# Patient Record
Sex: Female | Born: 1975 | Race: White | Hispanic: No | Marital: Married | Smoking: Never smoker
Health system: Southern US, Community
[De-identification: ages and names within clinical notes are randomized; demographics above are authoritative.]

## PROBLEM LIST (undated history)

## (undated) DIAGNOSIS — B977 Papillomavirus as the cause of diseases classified elsewhere: Secondary | ICD-10-CM

## (undated) DIAGNOSIS — F319 Bipolar disorder, unspecified: Secondary | ICD-10-CM

## (undated) DIAGNOSIS — I10 Essential (primary) hypertension: Secondary | ICD-10-CM

## (undated) DIAGNOSIS — A6000 Herpesviral infection of urogenital system, unspecified: Secondary | ICD-10-CM

## (undated) HISTORY — PX: DILATION AND CURETTAGE OF UTERUS: SHX78

## (undated) HISTORY — PX: BREAST SURGERY: SHX581

## (undated) HISTORY — PX: TUBAL LIGATION: SHX77

## (undated) HISTORY — PX: ABDOMINAL HYSTERECTOMY: SHX81

---

## 2004-05-17 ENCOUNTER — Observation Stay: Payer: Self-pay | Admitting: Unknown Physician Specialty

## 2004-05-18 ENCOUNTER — Other Ambulatory Visit: Payer: Self-pay

## 2004-07-03 ENCOUNTER — Observation Stay: Payer: Self-pay

## 2004-08-10 ENCOUNTER — Observation Stay: Payer: Self-pay

## 2004-08-11 ENCOUNTER — Observation Stay: Payer: Self-pay | Admitting: Unknown Physician Specialty

## 2004-08-15 ENCOUNTER — Inpatient Hospital Stay: Payer: Self-pay | Admitting: Obstetrics & Gynecology

## 2006-05-12 ENCOUNTER — Inpatient Hospital Stay: Payer: Self-pay | Admitting: Unknown Physician Specialty

## 2006-05-22 ENCOUNTER — Ambulatory Visit: Payer: Self-pay | Admitting: Unknown Physician Specialty

## 2006-06-12 ENCOUNTER — Ambulatory Visit: Payer: Self-pay | Admitting: Unknown Physician Specialty

## 2006-07-13 ENCOUNTER — Ambulatory Visit: Payer: Self-pay | Admitting: Unknown Physician Specialty

## 2006-08-11 ENCOUNTER — Ambulatory Visit: Payer: Self-pay | Admitting: Unknown Physician Specialty

## 2007-12-20 ENCOUNTER — Other Ambulatory Visit: Payer: Self-pay

## 2007-12-20 ENCOUNTER — Inpatient Hospital Stay: Payer: Self-pay | Admitting: Internal Medicine

## 2008-04-24 ENCOUNTER — Ambulatory Visit: Payer: Self-pay | Admitting: Internal Medicine

## 2008-12-29 ENCOUNTER — Ambulatory Visit: Payer: Self-pay | Admitting: Obstetrics & Gynecology

## 2009-02-13 IMAGING — CT CT HEAD WITHOUT CONTRAST
2 series · 16 of 30 positions shown, 20 images · non-contrast
Comparison: none

REASON FOR EXAM: Unresponsive
COMMENTS:

[Series 2: without · axial · non-contrast · 0.39mm/px · z∈[-88,+36]mm · 13 of 31 slices shown, 17 images]
[im 3/31  brain]
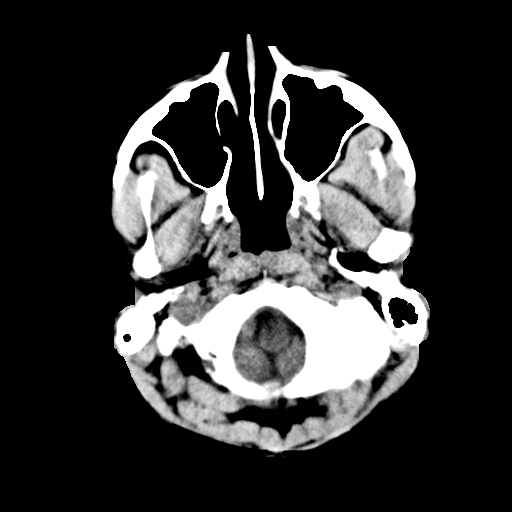
[im 3/31  bone]
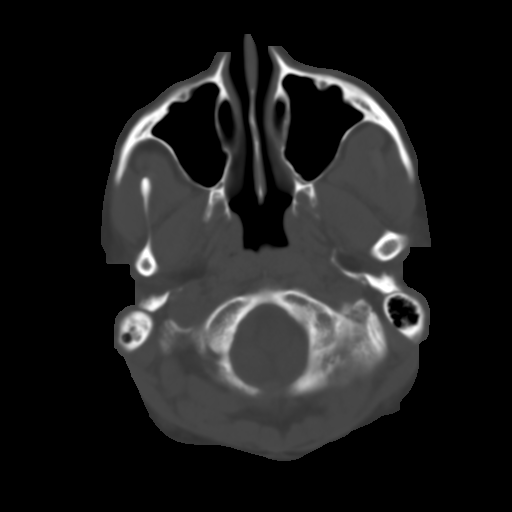
[im 5/31  brain]
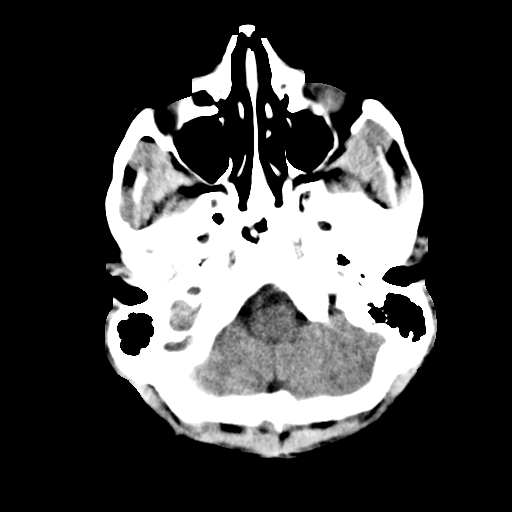
[im 7/31  brain]
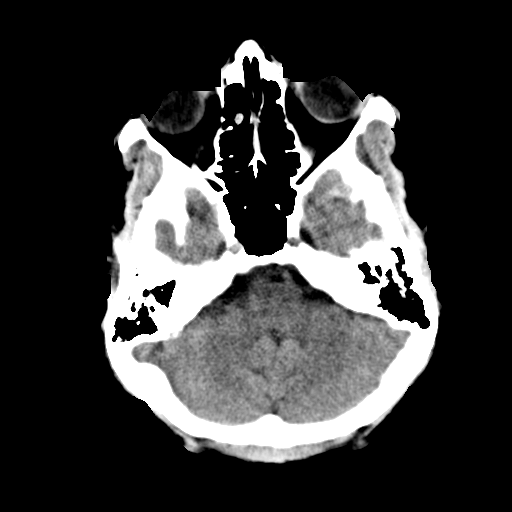
[im 9/31  brain]
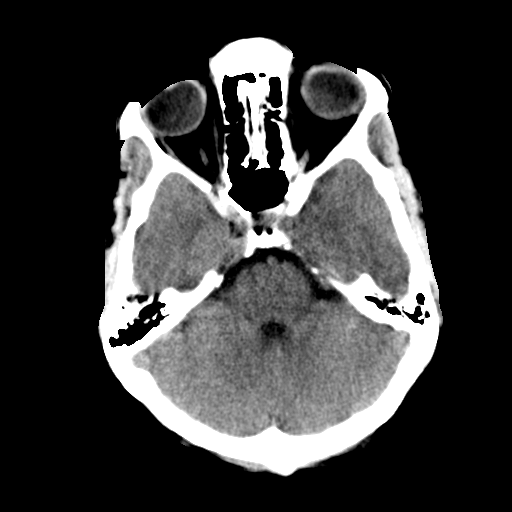
[im 11/31  brain]
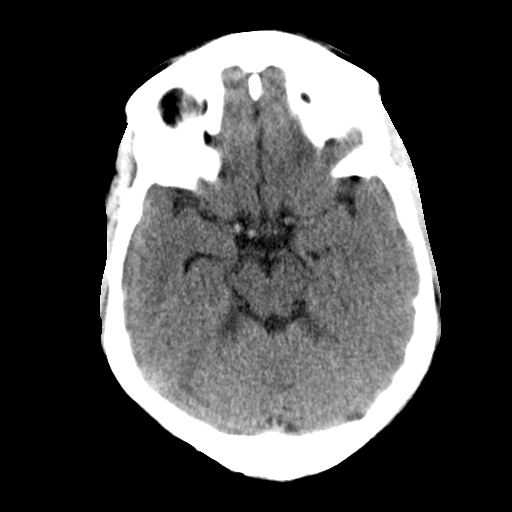
[im 11/31  bone]
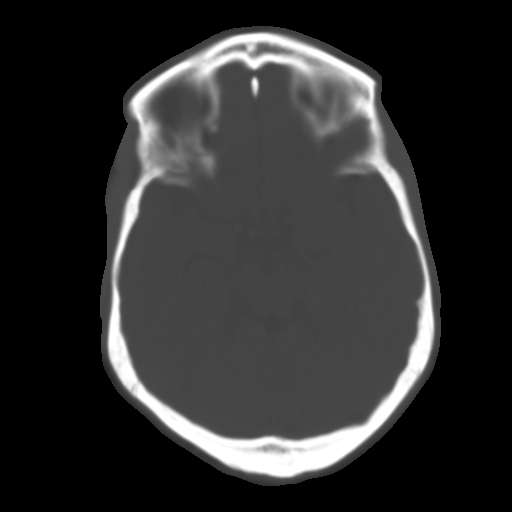
[im 13/31  brain]
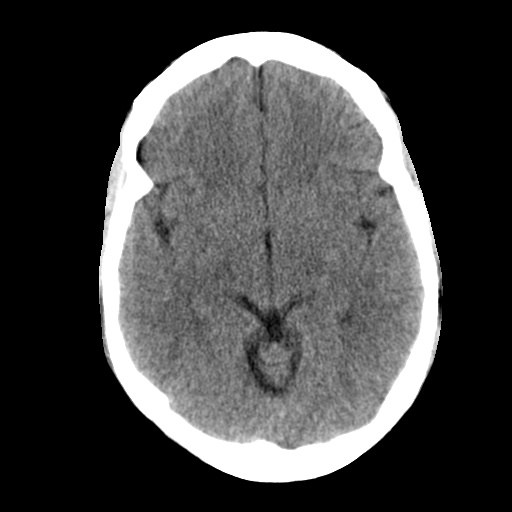
[im 16/31  brain]
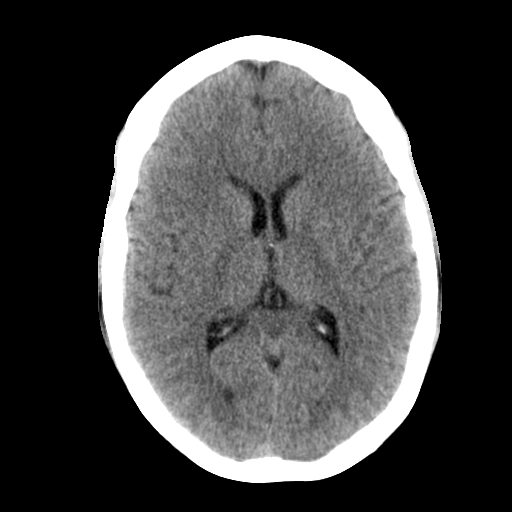
[im 18/31  brain]
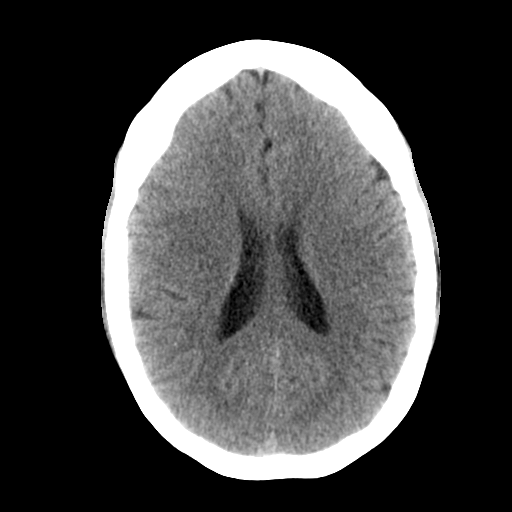
[im 20/31  brain]
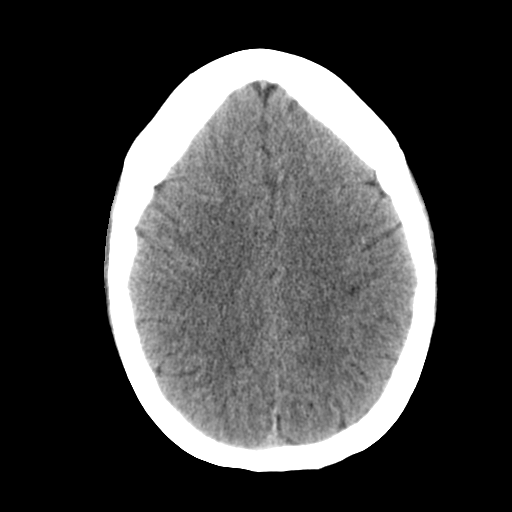
[im 20/31  bone]
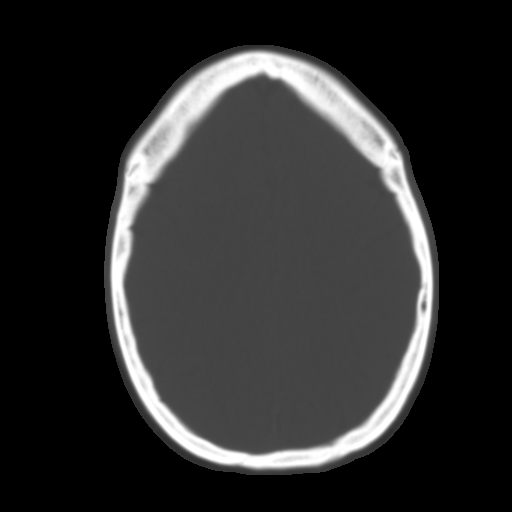
[im 22/31  brain]
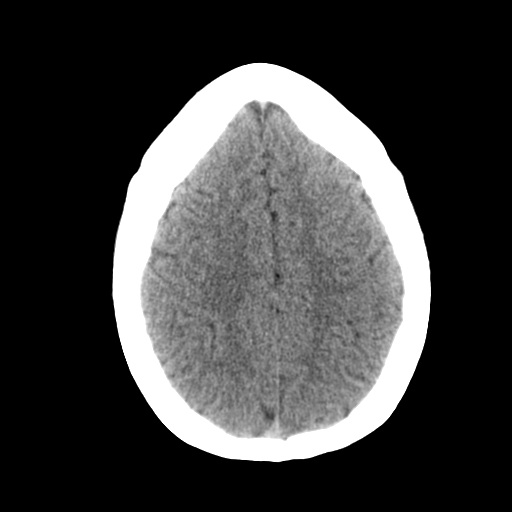
[im 24/31  brain]
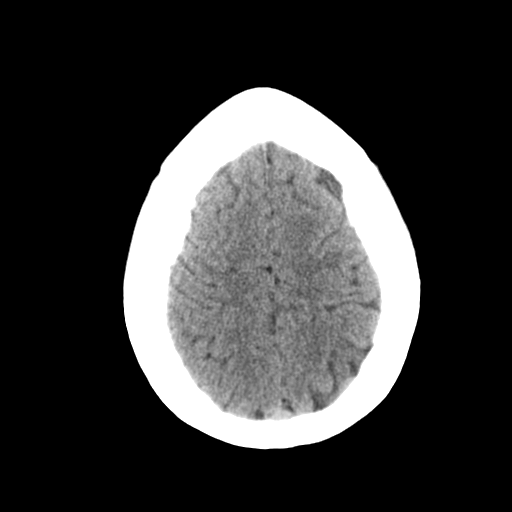
[im 26/31  brain]
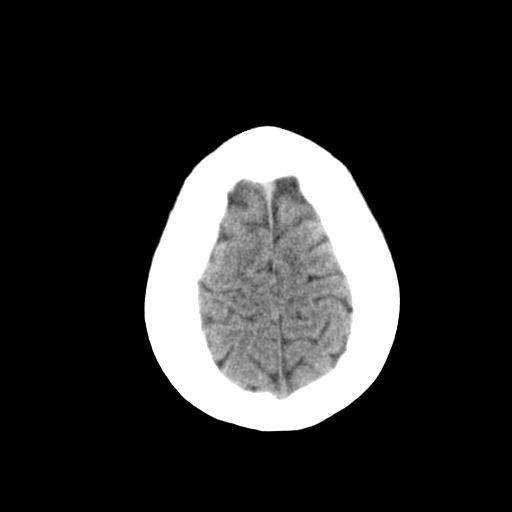
[im 28/31  brain]
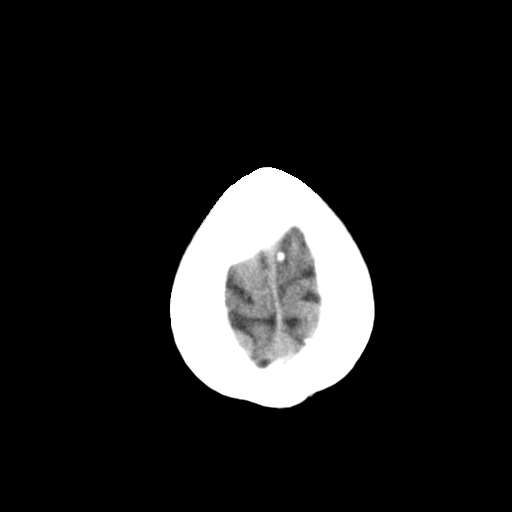
[im 28/31  bone]
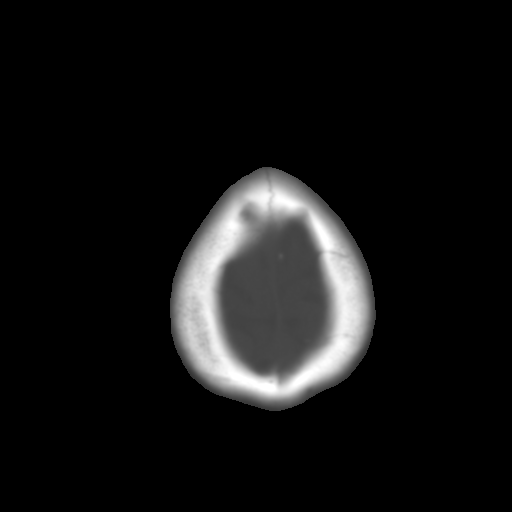

[Series 3: bone · axial · 0.39mm/px · z∈[-88,-48]mm · 3 of 31 slices shown]
[im 3/31  bone]
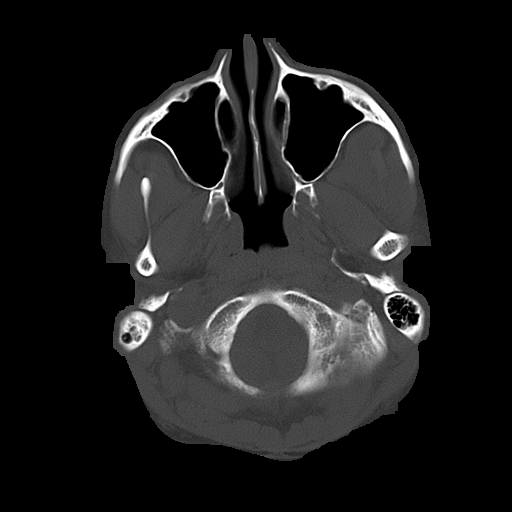
[im 7/31  bone]
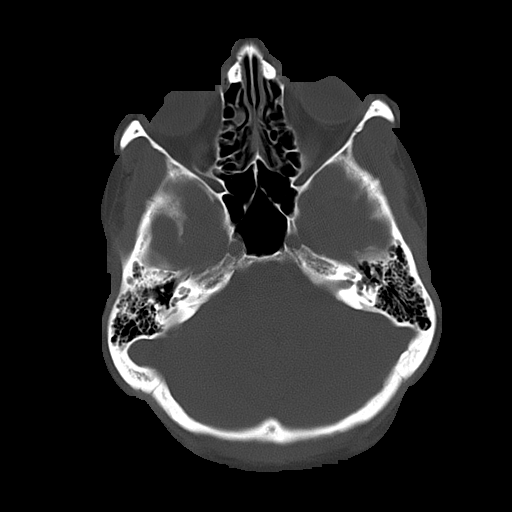
[im 11/31  bone]
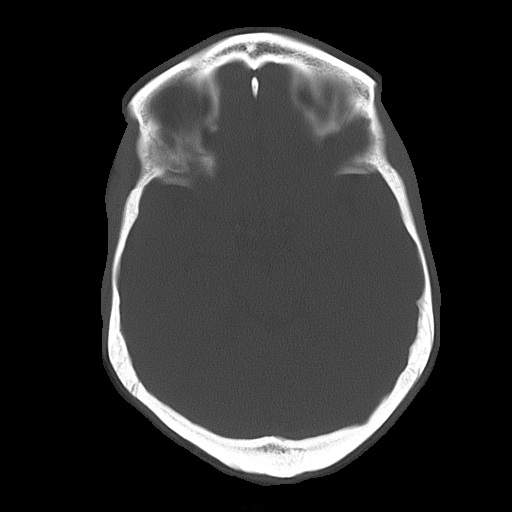

[16 of 30 positions shown; findings below may reference images not displayed]

PROCEDURE:     CT  - CT HEAD WITHOUT CONTRAST  - December 20, 2007 [DATE]

RESULT:     Noncontrast emergent CT of the brain is performed in the
standard fashion. There is no previous examination for comparison.

The ventricles and sulci are normal. There is no hemorrhage. There is no
focal mass, mass-effect or midline shift. There is no evidence of edema or
territorial infarct. The bone windows demonstrate normal aeration of the
paranasal sinuses and mastoid air cells. There is no skull fracture
demonstrated.
IMPRESSION: 1. No acute intracranial abnormality.
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
## 2010-03-23 ENCOUNTER — Emergency Department: Payer: Self-pay | Admitting: Unknown Physician Specialty

## 2010-08-08 ENCOUNTER — Inpatient Hospital Stay: Payer: Self-pay | Admitting: Obstetrics & Gynecology

## 2011-08-22 ENCOUNTER — Ambulatory Visit: Payer: Self-pay | Admitting: Obstetrics and Gynecology

## 2011-08-22 LAB — CBC
HGB: 12.3 g/dL (ref 12.0–16.0)
MCH: 30.5 pg (ref 26.0–34.0)
MCV: 92 fL (ref 80–100)
Platelet: 225 10*3/uL (ref 150–440)
RDW: 13.6 % (ref 11.5–14.5)

## 2011-08-24 ENCOUNTER — Ambulatory Visit: Payer: Self-pay | Admitting: Obstetrics and Gynecology

## 2011-08-28 LAB — PATHOLOGY REPORT

## 2012-10-18 ENCOUNTER — Observation Stay: Payer: Self-pay | Admitting: Obstetrics and Gynecology

## 2012-11-01 ENCOUNTER — Observation Stay: Payer: Self-pay | Admitting: Obstetrics & Gynecology

## 2012-11-09 ENCOUNTER — Inpatient Hospital Stay: Payer: Self-pay

## 2012-11-09 LAB — CBC WITH DIFFERENTIAL/PLATELET
Basophil #: 0 10*3/uL (ref 0.0–0.1)
Eosinophil %: 0.9 %
HCT: 30.8 % — ABNORMAL LOW (ref 35.0–47.0)
HGB: 10.9 g/dL — ABNORMAL LOW (ref 12.0–16.0)
Lymphocyte #: 1.7 10*3/uL (ref 1.0–3.6)
MCH: 30.8 pg (ref 26.0–34.0)
MCV: 87 fL (ref 80–100)
Monocyte %: 6.7 %
Neutrophil #: 7.4 10*3/uL — ABNORMAL HIGH (ref 1.4–6.5)
Neutrophil %: 74.9 %
Platelet: 190 10*3/uL (ref 150–440)
RBC: 3.52 10*6/uL — ABNORMAL LOW (ref 3.80–5.20)

## 2012-11-10 LAB — HEMATOCRIT: HCT: 30.2 % — ABNORMAL LOW (ref 35.0–47.0)

## 2012-11-12 LAB — PATHOLOGY REPORT

## 2014-10-02 NOTE — Op Note (Signed)
PATIENT NAME:  Stacey Yates, Stacey Yates MR#:  [id number] DATE OF BIRTH:  [birth year]  DATE OF PROCEDURE:    PREOPERATIVE DIAGNOSIS:  Multiparous female desiring permanent sterilization.   POSTOPERATIVE DIAGNOSIS:  Female, sterilized.   ESTIMATED BLOOD LOSS:  50 mL.   SURGEON:  Elliot Gurneyarrie C Zhanae Proffit, MD   FINDINGS:  Bilateral tubal ostia with telescoping, normal postpartum fundus.   DESCRIPTION OF PROCEDURE:  The patient was taken to the Operating Room and placed in supine position. After adequate general endotracheal anesthesia was instilled, the patient was prepped and draped in the usual sterile fashion. An umbilical incision was made after Marcaine was injected and after a timeout was performed. The incision was carried sharply down to the fascia. The fascia was grasped with an Allis clamp, nicked and entered. The patient was placed in a left lateral tilt. The left tube was grasped and injected with Marcainein the broad ligament.t. Two sutures of plain gut were placed approximately 2 cm apart. The tube was removed. Good hemostasis identified. The tube was allowed to fall back into the belly. Then the right tube was grasped in similar fashion. An incision was made in the broad ligament. Two pieces of suture were placed approximately 2 cm apart. The tube was excised, good telescoping was seen. Good hemostasis was identified. The patient was taken out of Trendelenburg. Bowels were allowed to fall back into the pelvis. Tubes were allowed to fall back into the belly. The fascial incision was closed with a running Vicryl suture, and 4-0 Monocryl was placed on the skin edges. Bandage was placed. The patient was taken to recovery after having tolerated the procedure well.     ____________________________ Elliot Gurneyarrie C. Jelissa Espiritu, MD cck:mr D: 11/17/2012 14:08:00 ET T: 11/17/2012 21:19:09 ET JOB#: [id number]  cc: Elliot Gurneyarrie C. Asja Frommer, MD, <Dictator> Elliot GurneyARRIE C Kennard Fildes MD ELECTRONICALLY SIGNED 11/19/2012 11:49

## 2014-10-20 NOTE — H&P (Signed)
L&D Evaluation:  History Expanded:  HPI 39 yo [id number] at 38 weeks with LOF.  Min contractions.  No vaginal bleeding.  Leakage has been one time. Prenatal Care a [facility]. Pt is AMA , Hypertensive, and bipolar, she has a history of HSV. Has history of PIH with pregnancies before and was induced both times.   Presents with elevated BP   Patient's Medical History Bipolar, HTN,  HSV   Patient's Surgical History breast   Medications Pre Natal Vitamins  Labetalol   Allergies NKDA   Social History none   Family History Non-Contributory   ROS:  ROS All systems were reviewed.  HEENT, CNS, GI, GU, Respiratory, CV, Renal and Musculoskeletal systems were found to be normal.   Exam:  Vital Signs stable   Urine Protein trace   General no apparent distress   Mental Status clear   Chest clear   Heart normal sinus rhythm   Abdomen gravid, non-tender   Estimated Fetal Weight Average for gestational age   Back no CVAT   Edema no edema   Pelvic no external lesions, No Nitrazine, No pooling, cervix min dilated per RN   Mebranes Intact   FHT normal rate with no decels   Ucx irregular   Skin dry   Other US- VTX, AFI 18.   Impression:  Impression LOF- No ROM.   Plan:  Plan EFM/NST   Comments Outpt management.   Follow Up Appointment already scheduled   Electronic Signatures: Letitia LibraHarris, Kenaz Olafson Paul (MD)  (Signed 23-May-14 15:20)  Authored: L&D Evaluation   Last Updated: 23-May-14 15:20 by Letitia LibraHarris, Lizette Pazos Paul (MD)

## 2014-10-20 NOTE — H&P (Signed)
L&D Evaluation:  History Expanded:  HPI 39yo WF who is on labetalol for labile pressures the second half of pregnancy. she is on labetalol, 200mg  bid. her bp tghis am without meds is 140/90. she is 39w 2 days by early US and she is 2 cm dilated in the office. she gor tdap on 09/02/12 she all genetics testing has been negative, she has a history of HSV and she is on valtrex, wioll continue in labor and post delivery. she desires a tubal ligation.   Gravida 6   Term 3   PreTerm 0   Abortion 2   Living 3   Blood Type (Maternal) O positive   Group B Strep Results Maternal (Result >5wks must be treated as unknown) negative   Maternal HIV Negative   Maternal Syphilis Ab Nonreactive   Maternal Varicella Immune   Rubella Results (Maternal) immune   Maternal [facility]Dap Immune   [facility]   Presents with contractions   Patient's Medical History Hypertension  HSV   Patient's Surgical History D&C  breast surgery   Medications Pre Natal Vitamins  labetalol   Allergies NKDA   Social History none   Family History Non-Contributory   Current Prenatal Course Notable For Pregnancy Induced Hypertension   ROS:  ROS All systems were reviewed.  HEENT, CNS, GI, GU, Respiratory, CV, Renal and Musculoskeletal systems were found to be normal.   Exam:  Vital Signs BP >140/90   Urine Protein not completed   General no apparent distress   Mental Status clear   Chest clear   Heart normal sinus rhythm   Abdomen gravid, non-tender   Estimated Fetal Weight Average for gestational age   Fetal Position vertex   Fundal Height term   Back no CVAT   Edema no edema   Reflexes 1+   Pelvic no external lesions, 2/50/-2   Mebranes Intact   FHT normal rate with no decels   Ucx irregular   Skin dry   Lymph no lymphadenopathy   Impression:  Impression early labor, PIH   Plan:  Plan PIH panel   Follow Up Appointment in 6 weeks   Electronic Signatures: Adria DevonKlett,  Amea Mcphail (MD)  (Signed 31-May-14 09:42)  Authored: L&D Evaluation   Last Updated: 31-May-14 09:42 by Adria DevonKlett, Rayquan Amrhein (MD)

## 2014-11-15 ENCOUNTER — Ambulatory Visit
Admission: EM | Admit: 2014-11-15 | Discharge: 2014-11-15 | Disposition: A | Discharge status: Home / Self Care | Payer: BLUE CROSS/BLUE SHIELD | Attending: Family Medicine | Admitting: Family Medicine

## 2014-11-15 ENCOUNTER — Encounter: Payer: Self-pay | Admitting: Gynecology

## 2014-11-15 DIAGNOSIS — Z79899 Other long term (current) drug therapy: Secondary | ICD-10-CM | POA: Insufficient documentation

## 2014-11-15 DIAGNOSIS — N390 Urinary tract infection, site not specified: Secondary | ICD-10-CM | POA: Diagnosis not present

## 2014-11-15 DIAGNOSIS — R319 Hematuria, unspecified: Secondary | ICD-10-CM | POA: Diagnosis not present

## 2014-11-15 DIAGNOSIS — I10 Essential (primary) hypertension: Secondary | ICD-10-CM | POA: Diagnosis not present

## 2014-11-15 HISTORY — DX: Essential (primary) hypertension: I10

## 2014-11-15 LAB — URINALYSIS COMPLETE WITH MICROSCOPIC (ARMC ONLY)
BILIRUBIN URINE: NEGATIVE
Glucose, UA: NEGATIVE mg/dL
KETONES UR: NEGATIVE mg/dL
NITRITE: NEGATIVE
Protein, ur: 100 mg/dL — AB
SPECIFIC GRAVITY, URINE: 1.02 (ref 1.005–1.030)
pH: 8.5 — ABNORMAL HIGH (ref 5.0–8.0)

## 2014-11-15 MED ORDER — CIPROFLOXACIN HCL 500 MG PO TABS: 500.0000 mg | ORAL_TABLET | Freq: Two times a day (BID) | ORAL | Status: DC

## 2014-11-15 MED ORDER — PHENAZOPYRIDINE HCL 200 MG PO TABS: 200.0000 mg | ORAL_TABLET | Freq: Three times a day (TID) | ORAL | Status: DC | PRN

## 2014-11-15 NOTE — ED Provider Notes (Signed)
CSN: [id number]     Arrival date & time 11/15/14  0929 History   First MD Initiated Contact with Patient 11/15/14 [phone]     Chief Complaint  Patient presents with  . Urinary Tract Infection   (Consider location/radiation/quality/duration/timing/severity/associated sxs/prior Treatment) Patient is a 39 y.o. female presenting with urinary tract infection. The history is provided by the patient. No language interpreter was used.  Urinary Tract Infection Pain quality:  Aching Pain severity:  Moderate Onset quality:  Sudden Duration:  3 days Timing:  Constant Progression:  Worsening Chronicity:  New Recent urinary tract infections: no   Relieved by:  Nothing Worsened by:  Nothing tried Ineffective treatments:  None tried Urinary symptoms: discolored urine, foul-smelling urine and frequent urination   Urinary symptoms: no hematuria and no hesitancy   Associated symptoms: abdominal pain   Associated symptoms: no fever, no flank pain, no nausea, no vaginal discharge and no vomiting   Associated symptoms comment:  Abdominal pain is more of a cramping type pain Risk factors: no hx of pyelonephritis     Past Medical History  Diagnosis Date  . Hypertension    History reviewed. No pertinent past surgical history. No family history on file. History  Substance Use Topics  . Smoking status: Never Smoker   . Smokeless tobacco: Not on file  . Alcohol Use: No   OB History    No data available     Review of Systems  Constitutional: Negative for fever.  Gastrointestinal: Positive for abdominal pain. Negative for nausea and vomiting.  Genitourinary: Negative for flank pain and vaginal discharge.  All other systems reviewed and are negative.   Allergies  Review of patient's allergies indicates no known allergies.  Home Medications   Prior to Admission medications   Medication Sig Start Date End Date Taking? Authorizing Provider  Diphenoxylate-Atropine (LOMOTIL PO) Take by mouth.    Yes Historical Provider, MD  ValACYclovir HCl (VALTREX PO) Take by mouth.   Yes Historical Provider, MD  ciprofloxacin (CIPRO) 500 MG tablet Take 1 tablet (500 mg total) by mouth 2 (two) times daily. 11/15/14   Hassan Rowan, MD  phenazopyridine (PYRIDIUM) 200 MG tablet Take 1 tablet (200 mg total) by mouth 3 (three) times daily as needed for pain. 11/15/14   Hassan Rowan, MD   BP 116/85 mmHg  Pulse 77  Temp(Src) 97 F (36.1 C) (Tympanic)  Ht  (1.6 m)  Wt 135 lb (61.236 kg)  BMI 23.92 kg/m2  SpO2 100%  LMP 10/31/2014 Physical Exam  Constitutional: She is oriented to person, place, and time. She appears well-developed and well-nourished.  HENT:  Head: Normocephalic and atraumatic.  Eyes: Pupils are equal, round, and reactive to light.  Abdominal: There is no tenderness. There is no CVA tenderness.  Neurological: She is alert and oriented to person, place, and time.  Skin: Skin is warm.  Psychiatric: Her behavior is normal.  Nursing note and vitals reviewed.   ED Course  Procedures (including critical care time) Labs Review Labs Reviewed  URINALYSIS COMPLETEWITH MICROSCOPIC ([facility] ONLY) - Abnormal; Notable for the following:    APPearance CLOUDY (*)    Hgb urine dipstick 2+ (*)    pH 8.5 (*)    Protein, ur 100 (*)    Leukocytes, UA 2+ (*)    Bacteria, UA FEW (*)    Squamous Epithelial / LPF 6-30 (*)    All other components within normal limits   Results for orders placed or performed during the  hospital encounter of 11/15/14  Urinalysis complete, with microscopic ([facility] only)  Result Value Ref Range   Color, Urine YELLOW YELLOW   APPearance CLOUDY (A) CLEAR   Glucose, UA NEGATIVE NEGATIVE mg/dL   Bilirubin Urine NEGATIVE NEGATIVE   Ketones, ur NEGATIVE NEGATIVE mg/dL   Specific Gravity, Urine 1.020 1.005 - 1.030   Hgb urine dipstick 2+ (A) NEGATIVE   pH 8.5 (H) 5.0 - 8.0   Protein, ur 100 (A) NEGATIVE mg/dL   Nitrite NEGATIVE NEGATIVE   Leukocytes, UA 2+ (A) NEGATIVE    RBC / HPF 6-30 <3 RBC/hpf   WBC, UA TOO NUMEROUS TO COUNT <3 WBC/hpf   Bacteria, UA FEW (A) RARE   Squamous Epithelial / LPF 6-30 (A) RARE   Trans Epithel, UA PRESENT    Ca Oxalate Crys, UA PRESENT    Imaging Review No results found.  We'll treat for UTI. She did request some Pyridium for help for discomfort. Because the hematuria present I will suggest that she follow-up with a doctor to 3 months for repeat urinalysis. MDM   1. Urinary tract infection with hematuria, site unspecified        Hassan RowanEugene Deniah Saia, MD 11/15/14 1028

## 2014-11-15 NOTE — ED Notes (Signed)
Patient c/o painful urination / frequency / burning x 3 days.

## 2014-11-15 NOTE — Discharge Instructions (Signed)

## 2014-11-19 LAB — URINE CULTURE: Culture: 80000

## 2016-05-28 ENCOUNTER — Ambulatory Visit
Admission: EM | Admit: 2016-05-28 | Discharge: 2016-05-28 | Disposition: A | Discharge status: Home / Self Care | Payer: BLUE CROSS/BLUE SHIELD | Attending: Family Medicine | Admitting: Family Medicine

## 2016-05-28 ENCOUNTER — Encounter: Payer: Self-pay | Admitting: Gynecology

## 2016-05-28 DIAGNOSIS — J111 Influenza due to unidentified influenza virus with other respiratory manifestations: Secondary | ICD-10-CM | POA: Diagnosis not present

## 2016-05-28 LAB — RAPID INFLUENZA A&B ANTIGENS (ARMC ONLY)
INFLUENZA A (ARMC): NEGATIVE
INFLUENZA B (ARMC): NEGATIVE

## 2016-05-28 MED ORDER — OSELTAMIVIR PHOSPHATE 75 MG PO CAPS: 75.0000 mg | ORAL_CAPSULE | Freq: Two times a day (BID) | ORAL | 0 refills | Status: AC

## 2016-05-28 NOTE — ED Provider Notes (Signed)
CSN: [id number]     Arrival date & time 05/28/16  1435 History   First MD Initiated Contact with Patient 05/28/16 1523     Chief Complaint  Patient presents with  . Generalized Body Aches  . Cough   (Consider location/radiation/quality/duration/timing/severity/associated sxs/prior Treatment) 40 year old female presents with body aches, chills, nasal congestion, and cough that started yesterday. Denies any fever, sore throat or GI symptoms. She did not get the flu vaccine this season. Son just tested positive today at Pediatrician for influenza. Has taken Tylenol and Aleve with minimal relief.    The history is provided by the patient.    Past Medical History:  Diagnosis Date  . Hypertension    History reviewed. No pertinent surgical history. No family history on file. Social History  Substance Use Topics  . Smoking status: Never Smoker  . Smokeless tobacco: Never Used  . Alcohol use No   OB History    No data available     Review of Systems  Constitutional: Positive for chills and fatigue. Negative for fever.  HENT: Positive for congestion and postnasal drip. Negative for ear discharge, ear pain, sinus pain, sinus pressure and sore throat.   Eyes: Negative for discharge.  Respiratory: Positive for cough. Negative for chest tightness, shortness of breath and wheezing.   Cardiovascular: Negative for chest pain.  Gastrointestinal: Negative for abdominal pain, diarrhea, nausea and vomiting.  Musculoskeletal: Positive for arthralgias and myalgias. Negative for neck pain and neck stiffness.  Skin: Negative for rash.  Neurological: Positive for headaches. Negative for dizziness, syncope, weakness and light-headedness.  Hematological: Negative for adenopathy.    Allergies  Patient has no known allergies.  Home Medications   Prior to Admission medications   Medication Sig Start Date End Date Taking? Authorizing Provider  Diphenoxylate-Atropine (LOMOTIL PO) Take by mouth.    Yes Historical Provider, MD  oseltamivir (TAMIFLU) 75 MG capsule Take 1 capsule (75 mg total) by mouth every 12 (twelve) hours. 05/28/16 06/02/16  Sudie GrumblingAnn Berry Morgon Pamer, NP   Meds Ordered and Administered this Visit  Medications - No data to display  BP (!) 133/91 (BP Location: Left Arm)   Pulse 93   Temp 98.6 F (37 C) (Tympanic)   Resp 16   Ht 5\' 3"  (1.6 m)   Wt 140 lb (63.5 kg)   LMP 02/11/2016 Comment: menopause  SpO2 99%   BMI 24.80 kg/m  No data found.   Physical Exam  Constitutional: She is oriented to person, place, and time. She appears well-developed and well-nourished. She appears ill. No distress.  HENT:  Head: Normocephalic and atraumatic.  Right Ear: Hearing, tympanic membrane, external ear and ear canal normal.  Left Ear: Hearing, tympanic membrane, external ear and ear canal normal.  Nose: Rhinorrhea present. Right sinus exhibits no maxillary sinus tenderness and no frontal sinus tenderness. Left sinus exhibits no maxillary sinus tenderness and no frontal sinus tenderness.  Mouth/Throat: Uvula is midline and mucous membranes are normal. Posterior oropharyngeal erythema present.  Neck: Normal range of motion. Neck supple.  Cardiovascular: Normal rate, regular rhythm and normal heart sounds.   Pulmonary/Chest: Effort normal and breath sounds normal. No respiratory distress. She has no decreased breath sounds. She has no wheezes. She has no rhonchi.  Lymphadenopathy:    She has no cervical adenopathy.  Neurological: She is alert and oriented to person, place, and time.  Skin: Skin is warm and dry. Capillary refill takes less than 2 seconds.  Psychiatric: She has a  normal mood and affect. Her behavior is normal. Judgment and thought content normal.    Urgent Care Course   Clinical Course     Procedures (including critical care time)  Labs Review Labs Reviewed  RAPID INFLUENZA A&B ANTIGENS ([facility] ONLY)    Imaging Review No results found.   Visual Acuity  Review  Right Eye Distance:   Left Eye Distance:   Bilateral Distance:    Right Eye Near:   Left Eye Near:    Bilateral Near:         MDM   1. Influenza    Discussed negative rapid flu test result with patient. Reviewed that she may still have influenza even with a negative test- especially since her son has the flu. Recommend start Tamiflu 75mg  twice a day as directed.  May take Tylenol or Aleve as needed for body aches. Increase fluid intake. Follow-up with a primary care provider in 3 to 4 days if not improving.    Sudie GrumblingAnn Berry Mehran Guderian, NP 05/28/16 2045

## 2016-05-28 NOTE — ED Triage Notes (Signed)
Patient c/o body ache / cough and nasal drip. Per patient her son was diagnose at the pediatric  x today with the Flu.

## 2016-05-28 NOTE — Discharge Instructions (Signed)
Recommend start Tamiflu 75mg  twice a day as directed. Take Tylenol every 8 hours as needed for body aches. Follow-up with a primary care provider in 3 to 4 days if not improving.

## 2016-06-16 ENCOUNTER — Encounter
Admission: RE | Admit: 2016-06-16 | Discharge: 2016-06-16 | Disposition: A | Discharge status: Home / Self Care | Payer: BLUE CROSS/BLUE SHIELD | Source: Ambulatory Visit | Attending: Obstetrics & Gynecology | Admitting: Obstetrics & Gynecology

## 2016-06-16 DIAGNOSIS — I10 Essential (primary) hypertension: Secondary | ICD-10-CM | POA: Diagnosis not present

## 2016-06-16 DIAGNOSIS — I498 Other specified cardiac arrhythmias: Secondary | ICD-10-CM | POA: Diagnosis not present

## 2016-06-16 HISTORY — DX: Bipolar disorder, unspecified: F31.9

## 2016-06-16 HISTORY — DX: Papillomavirus as the cause of diseases classified elsewhere: B97.7

## 2016-06-16 NOTE — Patient Instructions (Signed)
  Your procedure is scheduled on: 06/20/16 Report to Day Surgery. [facility]LO[facility] To find out your arrival time please call [phone] between 1PM - 3PM on 06/19/16  Remember: Instructions that are not followed completely may result in serious medical risk, up to and including death, or upon the discretion of your surgeon and anesthesiologist your surgery may need to be rescheduled.    _X___ 1. Do not eat food or drink liquids after midnight. No gum chewing or hard candies.     ____ 2. No Alcohol for 24 hours before or after surgery.   ____ 3. Do Not Smoke For 24 Hours Prior to Your Surgery.   ____ 4. Bring all medications with you on the day of surgery if instructed.    __X__ 5. Notify your doctor if there is any change in your medical condition     (cold, fever, infections).       Do not wear jewelry, make-up, hairpins, clips or nail polish.  Do not wear lotions, powders, or perfumes. You may wear deodorant.  Do not shave 48 hours prior to surgery. Men may shave face and neck.  Do not bring valuables to the hospital.    [facility] is not [facility] for any belongings or valuables.               Contacts, dentures or bridgework may not be worn into surgery.  Leave your suitcase in the car. After surgery it may be brought to your room.  For patients admitted to the hospital, discharge time is determined by your                treatment team.   Patients discharged the day of surgery will not be allowed to drive home.   ____ Take these medicines the morning of surgery with A SIP OF WATER:    1.NONE  2.   3.   4.  5.  6.  ____ Fleet Enema (as directed)   ____ Use CHG Soap as directed  ____ Use inhalers on the day of surgery  ____ Stop metformin 2 days prior to surgery    ____ Take 1/2 of usual insulin dose the night before surgery and none on the morning of surgery.   ____ Stop Coumadin/Plavix/aspirin on   ____ Stop Anti-inflammatories on  ____ Stop  supplements until after surgery.    ____ Bring C-Pap to the hospital.

## 2016-06-20 ENCOUNTER — Ambulatory Visit
Admission: RE | Admit: 2016-06-20 | Discharge: 2016-06-20 | Disposition: A | Discharge status: Home / Self Care | Payer: BLUE CROSS/BLUE SHIELD | Source: Ambulatory Visit | Attending: Obstetrics & Gynecology | Admitting: Obstetrics & Gynecology

## 2016-06-20 ENCOUNTER — Ambulatory Visit: Payer: BLUE CROSS/BLUE SHIELD | Admitting: Certified Registered"

## 2016-06-20 ENCOUNTER — Encounter: Payer: Self-pay | Admitting: *Deleted

## 2016-06-20 ENCOUNTER — Encounter
Admission: RE | Disposition: A | Discharge status: Home / Self Care | Payer: Self-pay | Source: Ambulatory Visit | Attending: Obstetrics & Gynecology

## 2016-06-20 DIAGNOSIS — I10 Essential (primary) hypertension: Secondary | ICD-10-CM | POA: Diagnosis not present

## 2016-06-20 DIAGNOSIS — N870 Mild cervical dysplasia: Secondary | ICD-10-CM | POA: Diagnosis not present

## 2016-06-20 HISTORY — PX: LEEP: SHX91

## 2016-06-20 LAB — POCT PREGNANCY, URINE: PREG TEST UR: NEGATIVE

## 2016-06-20 SURGERY — LEEP (LOOP ELECTROSURGICAL EXCISION PROCEDURE)
Anesthesia: General

## 2016-06-20 MED ORDER — LIDOCAINE 2% (20 MG/ML) 5 ML SYRINGE
INTRAMUSCULAR | Status: AC
  Filled 2016-06-20: qty 5

## 2016-06-20 MED ORDER — PROPOFOL 10 MG/ML IV BOLUS
INTRAVENOUS | Status: AC
  Filled 2016-06-20: qty 20

## 2016-06-20 MED ORDER — MIDAZOLAM HCL 2 MG/2ML IJ SOLN
INTRAMUSCULAR | Status: DC | PRN
  Administered 2016-06-20: 2 mg via INTRAVENOUS

## 2016-06-20 MED ORDER — LIDOCAINE-EPINEPHRINE 2 %-1:100000 IJ SOLN
INTRAMUSCULAR | Status: AC
  Filled 2016-06-20: qty 50

## 2016-06-20 MED ORDER — DEXAMETHASONE SODIUM PHOSPHATE 10 MG/ML IJ SOLN
INTRAMUSCULAR | Status: DC | PRN
  Administered 2016-06-20: 4 mg via INTRAVENOUS

## 2016-06-20 MED ORDER — IODINE STRONG (LUGOLS) 5 % PO SOLN
ORAL | Status: AC
  Filled 2016-06-20: qty 1

## 2016-06-20 MED ORDER — FAMOTIDINE 20 MG PO TABS
20.0000 mg | ORAL_TABLET | Freq: Once | ORAL | Status: AC
  Administered 2016-06-20: 20 mg via ORAL

## 2016-06-20 MED ORDER — ONDANSETRON HCL 4 MG/2ML IJ SOLN
INTRAMUSCULAR | Status: AC
  Filled 2016-06-20: qty 2

## 2016-06-20 MED ORDER — OXYCODONE-ACETAMINOPHEN 5-325 MG PO TABS
ORAL_TABLET | ORAL | Status: AC
  Filled 2016-06-20: qty 1

## 2016-06-20 MED ORDER — MORPHINE SULFATE (PF) 4 MG/ML IV SOLN: 1.0000 mg | INTRAVENOUS | Status: DC | PRN

## 2016-06-20 MED ORDER — ACETAMINOPHEN 650 MG RE SUPP
650.0000 mg | RECTAL | Status: DC | PRN
  Filled 2016-06-20: qty 1

## 2016-06-20 MED ORDER — LIDOCAINE HCL (CARDIAC) 20 MG/ML IV SOLN
INTRAVENOUS | Status: DC | PRN
  Administered 2016-06-20: 60 mg via INTRAVENOUS

## 2016-06-20 MED ORDER — ONDANSETRON HCL 4 MG/2ML IJ SOLN
INTRAMUSCULAR | Status: DC | PRN
  Administered 2016-06-20: 4 mg via INTRAVENOUS

## 2016-06-20 MED ORDER — OXYCODONE-ACETAMINOPHEN 5-325 MG PO TABS
1.0000 | ORAL_TABLET | ORAL | Status: DC | PRN
  Administered 2016-06-20: 1 via ORAL

## 2016-06-20 MED ORDER — ONDANSETRON HCL 4 MG/2ML IJ SOLN: 4.0000 mg | Freq: Once | INTRAMUSCULAR | Status: DC | PRN

## 2016-06-20 MED ORDER — OXYCODONE-ACETAMINOPHEN 5-325 MG PO TABS
1.0000 | ORAL_TABLET | ORAL | 0 refills | Status: DC | PRN
Start: 2016-06-20 — End: 2017-03-09

## 2016-06-20 MED ORDER — MIDAZOLAM HCL 2 MG/2ML IJ SOLN
INTRAMUSCULAR | Status: AC
  Filled 2016-06-20: qty 2

## 2016-06-20 MED ORDER — IODINE STRONG (LUGOLS) 5 % PO SOLN
ORAL | Status: DC | PRN
  Administered 2016-06-20: 3 mL

## 2016-06-20 MED ORDER — FENTANYL CITRATE (PF) 100 MCG/2ML IJ SOLN: 25.0000 ug | INTRAMUSCULAR | Status: DC | PRN

## 2016-06-20 MED ORDER — FERRIC SUBSULFATE 259 MG/GM EX SOLN
CUTANEOUS | Status: AC
  Filled 2016-06-20: qty 8

## 2016-06-20 MED ORDER — FENTANYL CITRATE (PF) 100 MCG/2ML IJ SOLN
INTRAMUSCULAR | Status: DC | PRN
  Administered 2016-06-20 (×2): 25 ug via INTRAVENOUS
  Administered 2016-06-20: 50 ug via INTRAVENOUS

## 2016-06-20 MED ORDER — ACETAMINOPHEN 325 MG PO TABS: 650.0000 mg | ORAL_TABLET | ORAL | Status: DC | PRN

## 2016-06-20 MED ORDER — KETOROLAC TROMETHAMINE 30 MG/ML IJ SOLN
30.0000 mg | Freq: Four times a day (QID) | INTRAMUSCULAR | Status: DC
  Filled 2016-06-20: qty 1

## 2016-06-20 MED ORDER — LIDOCAINE-EPINEPHRINE 2 %-1:100000 IJ SOLN
INTRAMUSCULAR | Status: DC | PRN
  Administered 2016-06-20: 10 mL

## 2016-06-20 MED ORDER — FENTANYL CITRATE (PF) 100 MCG/2ML IJ SOLN
INTRAMUSCULAR | Status: AC
  Filled 2016-06-20: qty 2

## 2016-06-20 MED ORDER — FAMOTIDINE 20 MG PO TABS
ORAL_TABLET | ORAL | Status: AC
  Administered 2016-06-20: 20 mg via ORAL
  Filled 2016-06-20: qty 1

## 2016-06-20 MED ORDER — LACTATED RINGERS IV SOLN
INTRAVENOUS | Status: DC
  Administered 2016-06-20: 11:00:00 via INTRAVENOUS

## 2016-06-20 MED ORDER — PROPOFOL 10 MG/ML IV BOLUS
INTRAVENOUS | Status: DC | PRN
Start: 2016-06-20 — End: 2016-06-20
  Administered 2016-06-20: 150 mg via INTRAVENOUS

## 2016-06-20 SURGICAL SUPPLY — 32 items
APPLICATOR COTTON TIP 6IN STRL (MISCELLANEOUS) ×3 IMPLANT
BAG COUNTER SPONGE EZ (MISCELLANEOUS) ×2 IMPLANT
CANISTER SUCT 1200ML W/VALVE (MISCELLANEOUS) ×3 IMPLANT
CATH ROBINSON RED A/P 16FR (CATHETERS) ×3 IMPLANT
CNTNR SPEC 2.5X3XGRAD LEK (MISCELLANEOUS) ×2
CONT SPEC 4OZ STER OR WHT (MISCELLANEOUS) ×4
CONTAINER SPEC 2.5X3XGRAD LEK (MISCELLANEOUS) ×2 IMPLANT
COUNTER SPONGE BAG EZ (MISCELLANEOUS) ×1
ELECT LEEP BALL 5MM 12CM (MISCELLANEOUS) ×3
ELECT LEEP LOOP 20X10 R2010 (MISCELLANEOUS) ×3
ELECT LOOP 1.0X1.0CM R1010 (MISCELLANEOUS) ×3
ELECT REM PT RETURN 9FT ADLT (ELECTROSURGICAL) ×3
ELECTRODE LEEP BALL 5MM 12CM (MISCELLANEOUS) ×1 IMPLANT
ELECTRODE LEP LOOP 20X10 R2010 (MISCELLANEOUS) ×1 IMPLANT
ELECTRODE LOOP 1.0X1.0CM R1010 (MISCELLANEOUS) ×1 IMPLANT
ELECTRODE REM PT RTRN 9FT ADLT (ELECTROSURGICAL) ×1 IMPLANT
GLOVE BIO SURGEON STRL SZ8 (GLOVE) ×3 IMPLANT
GOWN STRL REUS W/ TWL LRG LVL3 (GOWN DISPOSABLE) ×1 IMPLANT
GOWN STRL REUS W/ TWL XL LVL3 (GOWN DISPOSABLE) ×1 IMPLANT
GOWN STRL REUS W/TWL LRG LVL3 (GOWN DISPOSABLE) ×2
GOWN STRL REUS W/TWL XL LVL3 (GOWN DISPOSABLE) ×2
NEEDLE SPNL 22GX3.5 QUINCKE BK (NEEDLE) ×3 IMPLANT
NS IRRIG 500ML POUR BTL (IV SOLUTION) ×3 IMPLANT
PACK DNC HYST (MISCELLANEOUS) ×3 IMPLANT
PAD OB MATERNITY 4.3X12.25 (PERSONAL CARE ITEMS) ×3 IMPLANT
PAD PREP 24X41 OB/GYN DISP (PERSONAL CARE ITEMS) ×3 IMPLANT
PENCIL ELECTRO HAND CTR (MISCELLANEOUS) ×3 IMPLANT
STRAP SAFETY BODY (MISCELLANEOUS) ×3 IMPLANT
STRAW SMOKE EVAC LEEP 6150 NON (MISCELLANEOUS) ×3 IMPLANT
SYR CONTROL 10ML (SYRINGE) ×3 IMPLANT
TUBING CONNECTING 10 (TUBING) ×2 IMPLANT
TUBING CONNECTING 10' (TUBING) ×1

## 2016-06-20 NOTE — Anesthesia Preprocedure Evaluation (Signed)
Anesthesia Evaluation  Patient identified by MRN, date of birth, ID band Patient awake    Reviewed: Allergy & Precautions, H&P , NPO status , Patient's Chart, lab work & pertinent test results, reviewed documented beta blocker date and time   Airway Mallampati: II  TM Distance: >3 FB Neck ROM: full    Dental  (+) Teeth Intact   Pulmonary neg pulmonary ROS,    Pulmonary exam normal        Cardiovascular Exercise Tolerance: Good hypertension, negative cardio ROS Normal cardiovascular exam Rate:Normal     Neuro/Psych PSYCHIATRIC DISORDERS negative neurological ROS  negative psych ROS   GI/Hepatic negative GI ROS, Neg liver ROS,   Endo/Other  negative endocrine ROS  Renal/GU negative Renal ROS  negative genitourinary   Musculoskeletal   Abdominal   Peds  Hematology negative hematology ROS (+)   Anesthesia Other Findings   Reproductive/Obstetrics negative OB ROS                             Anesthesia Physical Anesthesia Plan  ASA: II  Anesthesia Plan: General LMA   Post-op Pain Management:    Induction:   Airway Management Planned:   Additional Equipment:   Intra-op Plan:   Post-operative Plan:   Informed Consent: I have reviewed the patients History and Physical, chart, labs and discussed the procedure including the risks, benefits and alternatives for the proposed anesthesia with the patient or authorized representative who has indicated his/her understanding and acceptance.     Plan Discussed with: CRNA  Anesthesia Plan Comments:         Anesthesia Quick Evaluation

## 2016-06-20 NOTE — OR Nursing (Signed)
Dr Harris into see pt

## 2016-06-20 NOTE — Progress Notes (Signed)
Pt up to bathroom to void  Little bleeding noted

## 2016-06-20 NOTE — Anesthesia Procedure Notes (Signed)
Procedure Name: LMA Insertion Performed by: Omere Marti Pre-anesthesia Checklist: Patient identified, Patient being monitored, Timeout performed, Emergency Drugs available and Suction available Patient Re-evaluated:Patient Re-evaluated prior to inductionOxygen Delivery Method: Circle system utilized Preoxygenation: Pre-oxygenation with 100% oxygen Intubation Type: IV induction LMA: LMA inserted LMA Size: 3.0 Tube type: Oral Number of attempts: 1 Placement Confirmation: positive ETCO2 and breath sounds checked- equal and bilateral Tube secured with: Tape Dental Injury: Teeth and Oropharynx as per pre-operative assessment        

## 2016-06-20 NOTE — H&P (Signed)
History and Physical Interval Note:  06/20/2016 10:32 AM  Stacey ScaleAngela M Scannell  has presented today for surgery, with the diagnosis of CERVICAL DYSPLASIA,PERSISTANT CIN1  The various methods of treatment have been discussed with the patient and family. After consideration of risks, benefits and other options for treatment, the patient has consented to  Procedure(s): LOOP ELECTROSURGICAL EXCISION PROCEDURE (LEEP) (N/A) as a surgical intervention .  The patient's history has been reviewed, patient examined, no change in status, stable for surgery.  Pt has the following beta blocker history-  Not taking Beta Blocker.  I have reviewed the patient's chart and labs.  Questions were answered to the patient's satisfaction.    [name] [name]

## 2016-06-20 NOTE — Op Note (Signed)
Operative Note   SURGERY DATE: 06/20/2016  PRE-OP DIAGNOSIS: Cervical Dysplasia I  POST-OP DIAGNOSIS: same  PROCEDURE: Exam under anesthesia, loop electricosurgical excisional procedure  SURGEON: Annamarie MajorPaul Layal Javid, MD, FACOG  ANESTHESIA: Choice   ESTIMATED BLOOD LOSS: Minimal   SPECIMENS: Ecto and endo cervical LEEP specimens   COMPLICATIONS: none  INDICATIONS: Persistant CIN I  FINDINGS: EGBUS and vagina within normal limits. Cervix grossly showed no ext TZ w Lugols.    PROCEDURE IN DETAIL: After informed consent was obtained, the patient was taken to the operating room where general anesthesia was administered without difficulty. The patient was positioned in the dorsal lithotomy position in [place] stirrups. Time-out was performed. The patient was examined under anesthesia, and the above findings were noted. She was prepped and draped in normal sterile fashion. The patient's bladder was cathaterized with an in and out catheter.  A weighted speculum and deaver were placed inside the patient's vagina. The above mentioned findings were noted. A sound was then used to trace the canal and it's width.  Using the LEEP device and loops for ecto- and endo- cervical specimens,  the conization was performed, making sure to encompass the entire lesion and transformation zone and to take a deeper second specimen. The cone bed was then cauterized to achieve hemostasis. Speculum was removed and patient awoken from anesthesia.  The patient tolerated the procedure well. Sponge, lap, needle, and instrument counts were correct x 2. The patient was transferred to the recovery room awake, alert and breathing independently in stable condition.

## 2016-06-20 NOTE — Discharge Instructions (Signed)
AMBULATORY SURGERY  DISCHARGE INSTRUCTIONS   1) The drugs that you were given will stay in your system until tomorrow so for the next 24 hours you should not:  A) Drive an automobile B) Make any legal decisions C) Drink any alcoholic beverage   2) You may resume regular meals tomorrow.  Today it is better to start with liquids and gradually work up to solid foods.  You may eat anything you prefer, but it is better to start with liquids, then soup and crackers, and gradually work up to solid foods.   3) Please notify your doctor immediately if you have any unusual bleeding, trouble breathing, redness and pain at the surgery site, drainage, fever, or pain not relieved by medication.    4) Additional Instructions:        Please contact your physician with any problems or Same Day Surgery at [phone], Monday through Friday 6 am to 4 pm, or [place] at [facility] Main number at [phone].AMBULATORY SURGERY  DISCHARGE INSTRUCTIONS   5) The drugs that you were given will stay in your system until tomorrow so for the next 24 hours you should not:  D) Drive an automobile E) Make any legal decisions F) Drink any alcoholic beverage   6) You may resume regular meals tomorrow.  Today it is better to start with liquids and gradually work up to solid foods.  You may eat anything you prefer, but it is better to start with liquids, then soup and crackers, and gradually work up to solid foods.   7) Please notify your doctor immediately if you have any unusual bleeding, trouble breathing, redness and pain at the surgery site, drainage, fever, or pain not relieved by medication.    8) Additional Instructions:        Please contact your physician with any problems or Same Day Surgery at [phone], Monday through Friday 6 am to 4 pm, or [place] at [facility] Main number at [phone].General Gynecological Post-Operative Instructions You may expect to feel dizzy,  weak, and drowsy for as long as 24 hours after receiving the medicine that made you sleep (anesthetic).  Do not drive a car, ride a bicycle, participate in physical activities, or take public transportation until you are done taking narcotic pain medicines or as directed by your doctor.  Do not drink alcohol or take tranquilizers.  Do not take medicine that has not been prescribed by your doctor.  Do not sign important papers or make important decisions while on narcotic pain medicines.  Have a responsible person with you.  CARE OF INCISION  Take showers instead of baths until your doctor gives you permission to take baths.   Avoid activities that may risk injury to your surgical site.  No sexual intercourse or placement of anything in the vagina for 4 weeks or as instructed by your doctor. Only take prescription or over-the-counter medicines  for pain, discomfort, or fever as directed by your doctor. Do not take aspirin. It can make you bleed. Take medicines (antibiotics) that kill germs if they are prescribed for you.  Call the office or go to the ER if:  You feel sick to your stomach (nauseous) and you start to throw up (vomit).  You have trouble eating or drinking.  You have an oral temperature above 101.  You have constipation that is not helped by adjusting diet or increasing fluid intake. Pain medicines are a common cause of constipation.  You have any other concerns. SEEK  IMMEDIATE MEDICAL CARE IF:  You have persistent dizziness.  You have difficulty breathing or a congested sounding (croupy) cough.  You have an oral temperature above 102.5, not controlled by medicine.  There is increasing pain or tenderness near or in the surgical site.    AMBULATORY SURGERY  DISCHARGE INSTRUCTIONS   9) The drugs that you were given will stay in your system until tomorrow so for the next 24 hours you should not:  G) Drive an automobile H) Make any legal decisions I) Drink any alcoholic  beverage   10) You may resume regular meals tomorrow.  Today it is better to start with liquids and gradually work up to solid foods.  You may eat anything you prefer, but it is better to start with liquids, then soup and crackers, and gradually work up to solid foods.   11) Please notify your doctor immediately if you have any unusual bleeding, trouble breathing, redness and pain at the surgery site, drainage, fever, or pain not relieved by medication.    12) Additional Instructions:        Please contact your physician with any problems or Same Day Surgery at [phone], Monday through Friday 6 am to 4 pm, or [place] at [facility] number at [phone].

## 2016-06-20 NOTE — Transfer of Care (Signed)
Immediate Anesthesia Transfer of Care Note  Patient: Stacey Yates  Procedure(s) Performed: Procedure(s): LOOP ELECTROSURGICAL EXCISION PROCEDURE (LEEP) (N/A)  Patient Location: PACU  Anesthesia Type:General  Level of Consciousness: sedated  Airway & Oxygen Therapy: Patient Spontanous Breathing and Patient connected to face mask oxygen  Post-op Assessment: Report given to RN and Post -op Vital signs reviewed and stable  Post vital signs: Reviewed and stable  Last Vitals:  Vitals:   06/20/16 1037 06/20/16 1209  BP: (!) 146/94 102/62  Pulse: 80 68  Resp: 16 (!) 9  Temp: 37.1 C 36.5 C    Last Pain:  Vitals:   06/20/16 1037  TempSrc: Tympanic  PainSc: 0-No pain         Complications: No apparent anesthesia complications

## 2016-06-21 ENCOUNTER — Encounter: Payer: Self-pay | Admitting: Obstetrics & Gynecology

## 2016-06-21 LAB — SURGICAL PATHOLOGY

## 2016-06-23 NOTE — Anesthesia Postprocedure Evaluation (Signed)
Anesthesia Post Note  Patient: Stacey Yates  Procedure(s) Performed: Procedure(s) (LRB): LOOP ELECTROSURGICAL EXCISION PROCEDURE (LEEP) (N/A)  Patient location during evaluation: PACU Anesthesia Type: General Level of consciousness: awake and alert Pain management: pain level controlled Vital Signs Assessment: post-procedure vital signs reviewed and stable Respiratory status: spontaneous breathing, nonlabored ventilation, respiratory function stable and patient connected to nasal cannula oxygen Cardiovascular status: blood pressure returned to baseline and stable Postop Assessment: no signs of nausea or vomiting Anesthetic complications: no     Last Vitals:  Vitals:   06/20/16 1306 06/20/16 1354  BP: (!) 142/98 133/86  Pulse: 70 73  Resp: 16 16  Temp: 36.5 C     Last Pain:  Vitals:   06/20/16 1400  TempSrc:   PainSc: 2                  Yevette EdwardsJames G Keonta Alsip

## 2016-07-26 ENCOUNTER — Ambulatory Visit: Payer: BLUE CROSS/BLUE SHIELD | Admitting: Podiatry

## 2016-08-07 ENCOUNTER — Encounter (INDEPENDENT_AMBULATORY_CARE_PROVIDER_SITE_OTHER): Payer: Self-pay | Admitting: Podiatry

## 2016-08-07 ENCOUNTER — Ambulatory Visit: Payer: BLUE CROSS/BLUE SHIELD

## 2016-08-07 DIAGNOSIS — M722 Plantar fascial fibromatosis: Secondary | ICD-10-CM

## 2016-08-07 NOTE — Progress Notes (Signed)
This encounter was created in error - please disregard.

## 2016-08-21 DIAGNOSIS — Z79899 Other long term (current) drug therapy: Secondary | ICD-10-CM | POA: Diagnosis not present

## 2016-08-21 DIAGNOSIS — F3112 Bipolar disorder, current episode manic without psychotic features, moderate: Secondary | ICD-10-CM | POA: Diagnosis not present

## 2016-08-21 DIAGNOSIS — F900 Attention-deficit hyperactivity disorder, predominantly inattentive type: Secondary | ICD-10-CM | POA: Diagnosis not present

## 2016-10-10 DIAGNOSIS — H5213 Myopia, bilateral: Secondary | ICD-10-CM | POA: Diagnosis not present

## 2016-12-07 DIAGNOSIS — Z0000 Encounter for general adult medical examination without abnormal findings: Secondary | ICD-10-CM | POA: Diagnosis not present

## 2016-12-08 DIAGNOSIS — Z0000 Encounter for general adult medical examination without abnormal findings: Secondary | ICD-10-CM | POA: Diagnosis not present

## 2016-12-18 DIAGNOSIS — F900 Attention-deficit hyperactivity disorder, predominantly inattentive type: Secondary | ICD-10-CM | POA: Diagnosis not present

## 2016-12-18 DIAGNOSIS — F3112 Bipolar disorder, current episode manic without psychotic features, moderate: Secondary | ICD-10-CM | POA: Diagnosis not present

## 2016-12-25 ENCOUNTER — Ambulatory Visit: Payer: BLUE CROSS/BLUE SHIELD | Admitting: Obstetrics & Gynecology

## 2017-01-24 ENCOUNTER — Ambulatory Visit: Payer: BLUE CROSS/BLUE SHIELD | Admitting: Obstetrics & Gynecology

## 2017-02-01 ENCOUNTER — Encounter: Payer: Self-pay | Admitting: Obstetrics & Gynecology

## 2017-02-01 ENCOUNTER — Ambulatory Visit (INDEPENDENT_AMBULATORY_CARE_PROVIDER_SITE_OTHER): Payer: BLUE CROSS/BLUE SHIELD | Admitting: Obstetrics & Gynecology

## 2017-02-01 VITALS — BP 120/80 | HR 75 | Ht 63.0 in | Wt 144.0 lb

## 2017-02-01 DIAGNOSIS — R87612 Low grade squamous intraepithelial lesion on cytologic smear of cervix (LGSIL): Secondary | ICD-10-CM | POA: Diagnosis not present

## 2017-02-01 DIAGNOSIS — N870 Mild cervical dysplasia: Secondary | ICD-10-CM | POA: Diagnosis not present

## 2017-02-01 NOTE — Patient Instructions (Signed)

## 2017-02-01 NOTE — Progress Notes (Signed)
  HPI:  Patient is a 41 y.o. [id number] presenting for follow up evaluation of abnormal PAP smear in the past.  Her last PAP was 6 months ago and was abnormal: LGSIL. She has had a prior colposcopy. Prior biopsies (if done) were CIN I.  LEEP 06/2016 with CIN I and clear margins.  PMHx: She  has a past medical history of Bipolar disorder (HCC); HPV in female; and Hypertension. Also,  has a past surgical history that includes Dilation and curettage of uterus; Tubal ligation; Breast surgery (Right); and LEEP (N/A, 06/20/2016)., family history is not on file.,  reports that she has never smoked. She has never used smokeless tobacco. She reports that she does not drink alcohol or use drugs.  She has a current medication list which includes the following prescription(s): amphetamine-dextroamphetamine, clonidine, lamotrigine, lisinopril-hydrochlorothiazide, norethindrone-ethinyl estradiol-fe biphas, oxycodone-acetaminophen, and valacyclovir. Also, has No Known Allergies.  Review of Systems  Constitutional: Negative for chills, fever and malaise/fatigue.  HENT: Negative for congestion, sinus pain and sore throat.   Eyes: Negative for blurred vision and pain.  Respiratory: Negative for cough and wheezing.   Cardiovascular: Negative for chest pain and leg swelling.  Gastrointestinal: Negative for abdominal pain, constipation, diarrhea, heartburn, nausea and vomiting.  Genitourinary: Negative for dysuria, frequency, hematuria and urgency.  Musculoskeletal: Negative for back pain, joint pain, myalgias and neck pain.  Skin: Negative for itching and rash.  Neurological: Negative for dizziness, tremors and weakness.  Endo/Heme/Allergies: Does not bruise/bleed easily.  Psychiatric/Behavioral: Negative for depression. The patient is not nervous/anxious and does not have insomnia.   All other systems reviewed and are negative.   Objective: BP 120/80   Pulse 75   Ht 5\' 3"  (1.6 m)   Wt 144 lb (65.3 kg)   LMP  01/19/2017   BMI 25.51 kg/m  Filed Weights   02/01/17 0813  Weight: 144 lb (65.3 kg)   Body mass index is 25.51 kg/m.  Physical examination Physical Exam  Constitutional: She is oriented to person, place, and time. She appears well-developed and well-nourished. No distress.  Genitourinary: Vagina normal and uterus normal. Pelvic exam was performed with patient supine. There is no rash, tenderness or lesion on the right labia. There is no rash, tenderness or lesion on the left labia. No erythema or bleeding in the vagina. Right adnexum does not display mass and does not display tenderness. Left adnexum does not display mass and does not display tenderness. Cervix does not exhibit motion tenderness, discharge, polyp or nabothian cyst.   Uterus is mobile and midaxial. Uterus is not enlarged or exhibiting a mass.  Abdominal: Soft. She exhibits no distension. There is no tenderness.  Musculoskeletal: Normal range of motion.  Neurological: She is alert and oriented to person, place, and time. No cranial nerve deficit.  Skin: Skin is warm and dry.  Psychiatric: She has a normal mood and affect.    ASSESSMENT:  History of Cervical Dysplasia  Plan:  1.  I discussed the grading system of pap smears and HPV high risk viral types.   2. Follow up PAP 6 months, vs intervention if high grade dysplasia identified. 3. Treatment of persistantly abnormal PAP smears and cervical dysplasia, even mild, is discussed w pt today in detail, as well as the pros and cons of Cryo and LEEP procedures. Will consider and discuss after results. 4. Annual 6 mos  Annamarie Major, MD, [facility], [facility] 02/01/2017  8:24 AM

## 2017-02-02 LAB — PAP IG (IMAGE GUIDED): PAP Smear Comment: 0

## 2017-02-05 ENCOUNTER — Telehealth: Payer: Self-pay | Admitting: Obstetrics & Gynecology

## 2017-02-05 ENCOUNTER — Encounter: Payer: Self-pay | Admitting: Obstetrics & Gynecology

## 2017-02-05 NOTE — Telephone Encounter (Signed)
-----   Message from Robert P Harris, MD sent at 02/05/2017 11:57 AM EDT ----- °Regarding: surgery °Surgery Booking Request °Patient Full Name:  Stacey Yates °MRN: [id number]  °DOB: 11/11/1975  °Surgeon: Robert Paul Harris, MD  °Requested Surgery Date and Time: Sept 27 °Primary Diagnosis AND Code: Cervical Dysplasia (N87.0) °Secondary Diagnosis and Code:  °Surgical Procedure: TLH/BS °L&D Notification: No °Admission Status: same day surgery °Length of Surgery: 1 °Special Case Needs: no °H&P: yes (date) °Phone Interview???: yes °Interpreter: °Language:  °Medical Clearance: no °Special Scheduling Instructions: no ° °

## 2017-02-05 NOTE — Telephone Encounter (Signed)
Patient is aware of H&P at [facility] on 03/02/17 @ 9:20am w/ Dr. Tiburcio Pea, Pre-admit Testing phone interview afterwards, and OR on 03/08/17. Ext given.

## 2017-02-05 NOTE — Telephone Encounter (Signed)
Surgery 9/27

## 2017-02-05 NOTE — Telephone Encounter (Signed)
-----   Message from Nadara Mustard, MD sent at 02/05/2017 11:57 AM EDT ----- Regarding: surgery Surgery Booking Request Patient Full Name:  Stacey Yates MRN: [id number]  DOB: 07-Jun-1976  Surgeon: Letitia Libra, MD  Requested Surgery Date and Time: Sept 27 Primary Diagnosis AND Code: Cervical Dysplasia (N87.0) Secondary Diagnosis and Code:  Surgical Procedure: TLH/BS L&D Notification: No Admission Status: same day surgery Length of Surgery: 1 Special Case Needs: no H&P: yes (date) Phone Interview???: yes Interpreter: Language:  Medical Clearance: no Special Scheduling Instructions: no

## 2017-02-28 ENCOUNTER — Other Ambulatory Visit: Payer: BLUE CROSS/BLUE SHIELD

## 2017-03-02 ENCOUNTER — Ambulatory Visit (INDEPENDENT_AMBULATORY_CARE_PROVIDER_SITE_OTHER): Payer: BLUE CROSS/BLUE SHIELD | Admitting: Obstetrics & Gynecology

## 2017-03-02 ENCOUNTER — Encounter: Payer: Self-pay | Admitting: Obstetrics & Gynecology

## 2017-03-02 ENCOUNTER — Inpatient Hospital Stay: Admission: RE | Admit: 2017-03-02 | Payer: BLUE CROSS/BLUE SHIELD | Source: Ambulatory Visit

## 2017-03-02 VITALS — BP 140/90 | HR 83 | Ht 63.0 in | Wt 141.0 lb

## 2017-03-02 DIAGNOSIS — N870 Mild cervical dysplasia: Secondary | ICD-10-CM | POA: Diagnosis not present

## 2017-03-02 NOTE — Progress Notes (Signed)
PRE-OPERATIVE HISTORY AND PHYSICAL EXAM  HPI:  Stacey Yates is a 41 y.o. [id number] Patient's last menstrual period was 02/19/2017.; she is being admitted for surgery related to recurrent cervical dysplasia despite LEEP therapy.Marland Kitchen  PMHx: Past Medical History:  Diagnosis Date  . Bipolar disorder (HCC)   . HPV in female   . Hypertension    Past Surgical History:  Procedure Laterality Date  . BREAST SURGERY Right   . DILATION AND CURETTAGE OF UTERUS    . LEEP N/A 06/20/2016   Procedure: LOOP ELECTROSURGICAL EXCISION PROCEDURE (LEEP);  Surgeon: Nadara Mustard, MD;  Location: [facility] ORS;  Service: Gynecology;  Laterality: N/A;  . TUBAL LIGATION     History reviewed. No pertinent family history. Social History  Substance Use Topics  . Smoking status: Never Smoker  . Smokeless tobacco: Never Used  . Alcohol use No     Comment: H/O ABUSE 8.5 YEARS AGO    Current Outpatient Prescriptions:  .  amphetamine-dextroamphetamine (ADDERALL) 10 MG tablet, Take 10 mg by mouth daily with breakfast. As needed, Disp: , Rfl:  .  cloNIDine (CATAPRES - DOSED IN MG/24 HR) 0.2 mg/24hr patch, Place 0.2 mg onto the skin once a week., Disp: , Rfl:  .  lamoTRIgine (LAMICTAL) 200 MG tablet, Take 400 mg by mouth daily. hs, Disp: , Rfl:  .  lisinopril-hydrochlorothiazide (PRINZIDE,ZESTORETIC) 20-12.5 MG tablet, Take 1 tablet by mouth daily., Disp: , Rfl:  .  Norethindrone-Ethinyl Estradiol-Fe Biphas (LO LOESTRIN FE) 1 MG-10 MCG / 10 MCG tablet, Take 1 tablet by mouth daily., Disp: , Rfl:  .  oxyCODONE-acetaminophen (PERCOCET) 5-325 MG tablet, Take 1 tablet by mouth every 4 (four) hours as needed for moderate pain or severe pain., Disp: 30 tablet, Rfl: 0 .  valACYclovir (VALTREX) 500 MG tablet, Take 500 mg by mouth 2 (two) times daily. As needed, Disp: , Rfl:  Allergies: Patient has no known allergies.  Review of Systems  Constitutional: Negative for chills, fever and malaise/fatigue.  HENT: Negative for  congestion, sinus pain and sore throat.   Eyes: Negative for blurred vision and pain.  Respiratory: Negative for cough and wheezing.   Cardiovascular: Negative for chest pain and leg swelling.  Gastrointestinal: Negative for abdominal pain, constipation, diarrhea, heartburn, nausea and vomiting.  Genitourinary: Negative for dysuria, frequency, hematuria and urgency.  Musculoskeletal: Negative for back pain, joint pain, myalgias and neck pain.  Skin: Negative for itching and rash.  Neurological: Negative for dizziness, tremors and weakness.  Endo/Heme/Allergies: Does not bruise/bleed easily.  Psychiatric/Behavioral: Negative for depression. The patient is not nervous/anxious and does not have insomnia.     Objective: BP 140/90   Pulse 83   Ht  (1.6 m)   Wt 141 lb (64 kg)   LMP 02/19/2017   BMI 24.98 kg/m   Filed Weights   03/02/17 0932  Weight: 141 lb (64 kg)   Physical Exam  Constitutional: She is oriented to person, place, and time. She appears well-developed and well-nourished. No distress.  Genitourinary: Rectum normal, vagina normal and uterus normal. Pelvic exam was performed with patient supine. There is no rash or lesion on the right labia. There is no rash or lesion on the left labia. Vagina exhibits no lesion. No bleeding in the vagina. Right adnexum does not display mass and does not display tenderness. Left adnexum does not display mass and does not display tenderness. Cervix does not exhibit motion tenderness, lesion, friability or polyp.  Uterus is mobile and midaxial. Uterus is not enlarged or exhibiting a mass.  HENT:  Head: Normocephalic and atraumatic. Head is without laceration.  Right Ear: Hearing normal.  Left Ear: Hearing normal.  Nose: No epistaxis.  No foreign bodies.  Mouth/Throat: Uvula is midline, oropharynx is clear and moist and mucous membranes are normal.  Eyes: Pupils are equal, round, and reactive to light.  Neck: Normal range of motion. Neck  supple. No thyromegaly present.  Cardiovascular: Normal rate and regular rhythm.  Exam reveals no gallop and no friction rub.   No murmur heard. Pulmonary/Chest: Effort normal and breath sounds normal. No respiratory distress. She has no wheezes. Right breast exhibits no mass, no skin change and no tenderness. Left breast exhibits no mass, no skin change and no tenderness.  Abdominal: Soft. Bowel sounds are normal. She exhibits no distension. There is no tenderness. There is no rebound.  Musculoskeletal: Normal range of motion.  Neurological: She is alert and oriented to person, place, and time. No cranial nerve deficit.  Skin: Skin is warm and dry.  Psychiatric: She has a normal mood and affect. Judgment normal.  Vitals reviewed.  Assessment: 1. 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  Preservation of ovaries based on age. No desire for fertility  I have had a careful discussion with this patient about all the options available and the risk/benefits of each. I have fully informed this patient that surgery may subject her to a variety of discomforts and risks: She understands that most patients have surgery with little difficulty, but problems can happen ranging from minor to fatal. These include nausea, vomiting, pain, bleeding, infection, poor healing, hernia, or formation of adhesions. Unexpected reactions may occur from any drug or anesthetic given. Unintended injury may occur to other pelvic or abdominal structures such as Fallopian tubes, ovaries, bladder, ureter (tube from kidney to bladder), or bowel. Nerves going from the pelvis to the legs may be injured. Any such injury may require immediate or later additional surgery to correct the problem. Excessive blood loss requiring transfusion is very unlikely but possible. Dangerous blood clots may form in the legs or lungs. Physical and sexual activity will be restricted in varying degrees  for an indeterminate period of time but most often 2-6 weeks.  Finally, she understands that it is impossible to list every possible undesirable effect and that the condition for which surgery is done is not always cured or significantly improved, and in rare cases may be even worse.Ample time was given to answer all questions.  Annamarie Major, MD, [facility], [facility], [facility] 03/02/2017  9:51 AM

## 2017-03-05 ENCOUNTER — Inpatient Hospital Stay: Admission: RE | Admit: 2017-03-05 | Payer: BLUE CROSS/BLUE SHIELD | Source: Ambulatory Visit

## 2017-03-06 ENCOUNTER — Encounter
Admission: RE | Admit: 2017-03-06 | Discharge: 2017-03-06 | Disposition: A | Discharge status: Home / Self Care | Payer: BLUE CROSS/BLUE SHIELD | Source: Ambulatory Visit | Attending: Obstetrics & Gynecology | Admitting: Obstetrics & Gynecology

## 2017-03-06 NOTE — Patient Instructions (Signed)
  Your procedure is scheduled on: 03-08-17 THURSDAY Report to Same Day Surgery 2nd floor medical mall [facility] Entrance-take elevator on left to 2nd floor.  Check in with surgery information desk.) To find out your arrival time please call [phone] between 1PM - 3PM on 03-07-17 [facility]  Remember: Instructions that are not followed completely may result in serious medical risk, up to and including death, or upon the discretion of your surgeon and anesthesiologist your surgery may need to be rescheduled.    _x___ 1. Do not eat food after midnight the night before your procedure. NO GUM CHEWING OR HARD CANDIES.  You may drink clear liquids up to 2 hours before you are scheduled to arrive at the hospital for your procedure.  Do not drink clear liquids within 2 hours of your scheduled arrival to the hospital.  Clear liquids include  --Water or Apple juice without pulp  --Clear carbohydrate beverage such as ClearFast or Gatorade  --Black Coffee or Clear Tea (No milk, no creamers, do not add anything to the coffee or Tea)  Type 1 and type 2 diabetics should only drink water.    __x__ 2. No Alcohol for 24 hours before or after surgery.   __x__3. No Smoking for 24 prior to surgery.   ____  4. Bring all medications with you on the day of surgery if instructed.    __x__ 5. Notify your doctor if there is any change in your medical condition     (cold, fever, infections).     Do not wear jewelry, make-up, hairpins, clips or nail polish.  Do not wear lotions, powders, or perfumes. You may wear deodorant.  Do not shave 48 hours prior to surgery. Men may shave face and neck.  Do not bring valuables to the hospital.    [facility] is not responsible for any belongings or valuables.               Contacts, dentures or bridgework may not be worn into surgery.  Leave your suitcase in the car. After surgery it may be brought to your room.  For patients admitted to the hospital, discharge time  is determined by your treatment team.   Patients discharged the day of surgery will not be allowed to drive home.  You will need someone to drive you home and stay with you the night of your procedure.    Please read over the following fact sheets that you were given:    ____ Take anti-hypertensive listed below, cardiac, seizure, asthma,anti-reflux and psychiatric medicines. These include:  1. NONE  2.  3.  4.  5.  6.  ____Fleets enema or Magnesium Citrate as directed.   _x___ Use CHG Soap or sage wipes as directed on instruction sheet   ____ Use inhalers on the day of surgery and bring to hospital day of surgery  ____ Stop Metformin and Janumet 2 days prior to surgery.    ____ Take 1/2 of usual insulin dose the night before surgery and none on the morning surgery.   ____ Follow recommendations from Cardiologist, Pulmonologist or PCP regarding stopping Aspirin, Coumadin, Plavix ,Eliquis, Effient, or Pradaxa, and Pletal.  X____Stop Anti-inflammatories such as Advil, Aleve, Ibuprofen, Motrin, Naproxen, Naprosyn, Goodies powders or aspirin products NOW-OK to take Tylenol    ____ Stop supplements until after surgery.     ____ Bring C-Pap to the hospital.

## 2017-03-07 ENCOUNTER — Encounter
Admission: RE | Admit: 2017-03-07 | Discharge: 2017-03-07 | Disposition: A | Discharge status: Home / Self Care | Payer: BLUE CROSS/BLUE SHIELD | Source: Ambulatory Visit | Attending: Obstetrics & Gynecology | Admitting: Obstetrics & Gynecology

## 2017-03-07 DIAGNOSIS — Z79899 Other long term (current) drug therapy: Secondary | ICD-10-CM | POA: Diagnosis not present

## 2017-03-07 DIAGNOSIS — F319 Bipolar disorder, unspecified: Secondary | ICD-10-CM | POA: Diagnosis not present

## 2017-03-07 DIAGNOSIS — N879 Dysplasia of cervix uteri, unspecified: Secondary | ICD-10-CM | POA: Diagnosis not present

## 2017-03-07 DIAGNOSIS — Z793 Long term (current) use of hormonal contraceptives: Secondary | ICD-10-CM | POA: Diagnosis not present

## 2017-03-07 DIAGNOSIS — I10 Essential (primary) hypertension: Secondary | ICD-10-CM | POA: Diagnosis not present

## 2017-03-07 LAB — CBC
HEMATOCRIT: 43.6 % (ref 35.0–47.0)
Hemoglobin: 15.1 g/dL (ref 12.0–16.0)
MCH: 30.9 pg (ref 26.0–34.0)
MCHC: 34.6 g/dL (ref 32.0–36.0)
MCV: 89.2 fL (ref 80.0–100.0)
PLATELETS: 268 10*3/uL (ref 150–440)
RBC: 4.89 MIL/uL (ref 3.80–5.20)
RDW: 13 % (ref 11.5–14.5)
WBC: 5.1 10*3/uL (ref 3.6–11.0)

## 2017-03-07 MED ORDER — CEFOXITIN SODIUM-DEXTROSE 2-2.2 GM-% IV SOLR (PREMIX)
2.0000 g | Freq: Once | INTRAVENOUS | Status: AC
  Administered 2017-03-08: 2 g via INTRAVENOUS

## 2017-03-08 ENCOUNTER — Observation Stay
Admission: RE | Admit: 2017-03-08 | Discharge: 2017-03-09 | Disposition: A | Discharge status: Home / Self Care | Payer: BLUE CROSS/BLUE SHIELD | Source: Ambulatory Visit | Attending: Obstetrics & Gynecology | Admitting: Obstetrics & Gynecology

## 2017-03-08 ENCOUNTER — Ambulatory Visit: Payer: BLUE CROSS/BLUE SHIELD | Admitting: Anesthesiology

## 2017-03-08 ENCOUNTER — Encounter
Admission: RE | Disposition: A | Discharge status: Home / Self Care | Payer: Self-pay | Source: Ambulatory Visit | Attending: Obstetrics & Gynecology

## 2017-03-08 DIAGNOSIS — N879 Dysplasia of cervix uteri, unspecified: Secondary | ICD-10-CM | POA: Diagnosis not present

## 2017-03-08 DIAGNOSIS — I10 Essential (primary) hypertension: Secondary | ICD-10-CM | POA: Diagnosis not present

## 2017-03-08 DIAGNOSIS — Z793 Long term (current) use of hormonal contraceptives: Secondary | ICD-10-CM | POA: Diagnosis not present

## 2017-03-08 DIAGNOSIS — N870 Mild cervical dysplasia: Secondary | ICD-10-CM

## 2017-03-08 DIAGNOSIS — F319 Bipolar disorder, unspecified: Secondary | ICD-10-CM | POA: Insufficient documentation

## 2017-03-08 DIAGNOSIS — Z79899 Other long term (current) drug therapy: Secondary | ICD-10-CM | POA: Diagnosis not present

## 2017-03-08 HISTORY — PX: TOTAL LAPAROSCOPIC HYSTERECTOMY WITH SALPINGECTOMY: SHX6742

## 2017-03-08 LAB — ABO/RH: ABO/RH(D): O POS

## 2017-03-08 LAB — POCT PREGNANCY, URINE: PREG TEST UR: NEGATIVE

## 2017-03-08 SURGERY — HYSTERECTOMY, TOTAL, LAPAROSCOPIC, WITH SALPINGECTOMY
Anesthesia: Choice | Laterality: Bilateral | Wound class: Clean Contaminated

## 2017-03-08 MED ORDER — ONDANSETRON HCL 4 MG PO TABS: 4.0000 mg | ORAL_TABLET | Freq: Four times a day (QID) | ORAL | Status: DC | PRN

## 2017-03-08 MED ORDER — DEXAMETHASONE SODIUM PHOSPHATE 10 MG/ML IJ SOLN
INTRAMUSCULAR | Status: DC | PRN
  Administered 2017-03-08: 10 mg via INTRAVENOUS

## 2017-03-08 MED ORDER — MORPHINE SULFATE (PF) 2 MG/ML IV SOLN: 1.0000 mg | INTRAVENOUS | Status: DC | PRN

## 2017-03-08 MED ORDER — EPHEDRINE SULFATE 50 MG/ML IJ SOLN
INTRAMUSCULAR | Status: DC | PRN
  Administered 2017-03-08 (×2): 10 mg via INTRAVENOUS

## 2017-03-08 MED ORDER — SENNOSIDES-DOCUSATE SODIUM 8.6-50 MG PO TABS: 1.0000 | ORAL_TABLET | Freq: Every evening | ORAL | Status: DC | PRN

## 2017-03-08 MED ORDER — AMPHETAMINE-DEXTROAMPHETAMINE 5 MG PO TABS: 10.0000 mg | ORAL_TABLET | Freq: Every day | ORAL | Status: DC

## 2017-03-08 MED ORDER — MIDAZOLAM HCL 2 MG/2ML IJ SOLN
INTRAMUSCULAR | Status: AC
  Filled 2017-03-08: qty 2

## 2017-03-08 MED ORDER — FENTANYL CITRATE (PF) 100 MCG/2ML IJ SOLN
25.0000 ug | INTRAMUSCULAR | Status: AC | PRN
  Administered 2017-03-08 (×6): 25 ug via INTRAVENOUS

## 2017-03-08 MED ORDER — LACTATED RINGERS IV SOLN
INTRAVENOUS | Status: DC
  Administered 2017-03-08: 17:00:00 via INTRAVENOUS

## 2017-03-08 MED ORDER — FENTANYL CITRATE (PF) 100 MCG/2ML IJ SOLN
INTRAMUSCULAR | Status: DC | PRN
  Administered 2017-03-08 (×2): 25 ug via INTRAVENOUS
  Administered 2017-03-08: 100 ug via INTRAVENOUS

## 2017-03-08 MED ORDER — FENTANYL CITRATE (PF) 100 MCG/2ML IJ SOLN
INTRAMUSCULAR | Status: AC
  Administered 2017-03-08: 25 ug via INTRAVENOUS
  Filled 2017-03-08: qty 2

## 2017-03-08 MED ORDER — OXYCODONE-ACETAMINOPHEN 5-325 MG PO TABS
1.0000 | ORAL_TABLET | ORAL | Status: DC | PRN
  Administered 2017-03-09: 1 via ORAL
  Filled 2017-03-08: qty 2

## 2017-03-08 MED ORDER — ATROPINE SULFATE 0.4 MG/ML IJ SOLN
INTRAMUSCULAR | Status: AC
  Filled 2017-03-08: qty 1

## 2017-03-08 MED ORDER — LISINOPRIL 10 MG PO TABS: 20.0000 mg | ORAL_TABLET | Freq: Every day | ORAL | Status: DC

## 2017-03-08 MED ORDER — BISACODYL 10 MG RE SUPP: 10.0000 mg | Freq: Every day | RECTAL | Status: DC | PRN

## 2017-03-08 MED ORDER — PROMETHAZINE HCL 25 MG PO TABS
12.5000 mg | ORAL_TABLET | Freq: Four times a day (QID) | ORAL | Status: DC | PRN
  Filled 2017-03-08: qty 1

## 2017-03-08 MED ORDER — OXYCODONE-ACETAMINOPHEN 5-325 MG PO TABS: 1.0000 | ORAL_TABLET | ORAL | 0 refills | Status: DC | PRN

## 2017-03-08 MED ORDER — GLYCOPYRROLATE 0.2 MG/ML IJ SOLN
INTRAMUSCULAR | Status: DC | PRN
  Administered 2017-03-08: .2 mg via INTRAVENOUS

## 2017-03-08 MED ORDER — PROPOFOL 10 MG/ML IV BOLUS
INTRAVENOUS | Status: AC
  Filled 2017-03-08: qty 20

## 2017-03-08 MED ORDER — ROCURONIUM BROMIDE 100 MG/10ML IV SOLN
INTRAVENOUS | Status: DC | PRN
  Administered 2017-03-08: 40 mg via INTRAVENOUS

## 2017-03-08 MED ORDER — ONDANSETRON HCL 4 MG/2ML IJ SOLN: 4.0000 mg | Freq: Once | INTRAMUSCULAR | Status: DC | PRN

## 2017-03-08 MED ORDER — MORPHINE SULFATE (PF) 10 MG/ML IV SOLN
INTRAVENOUS | Status: AC
  Administered 2017-03-08: 2 mg
  Filled 2017-03-08: qty 1

## 2017-03-08 MED ORDER — SUGAMMADEX SODIUM 200 MG/2ML IV SOLN
INTRAVENOUS | Status: DC | PRN
  Administered 2017-03-08: 150 mg via INTRAVENOUS

## 2017-03-08 MED ORDER — ROCURONIUM BROMIDE 50 MG/5ML IV SOLN
INTRAVENOUS | Status: AC
  Filled 2017-03-08: qty 1

## 2017-03-08 MED ORDER — CEFOXITIN SODIUM-DEXTROSE 2-2.2 GM-% IV SOLR (PREMIX)
INTRAVENOUS | Status: AC
  Filled 2017-03-08: qty 50

## 2017-03-08 MED ORDER — ONDANSETRON HCL 4 MG/2ML IJ SOLN
4.0000 mg | INTRAMUSCULAR | Status: DC | PRN
  Administered 2017-03-08 – 2017-03-09 (×3): 4 mg via INTRAVENOUS
  Filled 2017-03-08 (×2): qty 2

## 2017-03-08 MED ORDER — GLYCOPYRROLATE 0.2 MG/ML IJ SOLN
INTRAMUSCULAR | Status: AC
  Filled 2017-03-08: qty 1

## 2017-03-08 MED ORDER — SUCCINYLCHOLINE CHLORIDE 20 MG/ML IJ SOLN
INTRAMUSCULAR | Status: AC
  Filled 2017-03-08: qty 1

## 2017-03-08 MED ORDER — ONDANSETRON HCL 4 MG/2ML IJ SOLN
INTRAMUSCULAR | Status: DC | PRN
  Administered 2017-03-08: 4 mg via INTRAVENOUS

## 2017-03-08 MED ORDER — FENTANYL CITRATE (PF) 100 MCG/2ML IJ SOLN
INTRAMUSCULAR | Status: AC
  Filled 2017-03-08: qty 2

## 2017-03-08 MED ORDER — LISINOPRIL-HYDROCHLOROTHIAZIDE 20-12.5 MG PO TABS: 1.0000 | ORAL_TABLET | Freq: Every day | ORAL | Status: DC

## 2017-03-08 MED ORDER — DOCUSATE SODIUM 100 MG PO CAPS
100.0000 mg | ORAL_CAPSULE | Freq: Two times a day (BID) | ORAL | Status: DC
  Administered 2017-03-08 – 2017-03-09 (×2): 100 mg via ORAL
  Filled 2017-03-08 (×2): qty 1

## 2017-03-08 MED ORDER — SEVOFLURANE IN SOLN
RESPIRATORY_TRACT | Status: AC
  Filled 2017-03-08: qty 250

## 2017-03-08 MED ORDER — ONDANSETRON HCL 4 MG/2ML IJ SOLN
4.0000 mg | Freq: Four times a day (QID) | INTRAMUSCULAR | Status: DC | PRN
  Filled 2017-03-08: qty 2

## 2017-03-08 MED ORDER — PROPOFOL 10 MG/ML IV BOLUS
INTRAVENOUS | Status: DC | PRN
  Administered 2017-03-08: 140 mg via INTRAVENOUS

## 2017-03-08 MED ORDER — LIDOCAINE HCL (CARDIAC) 20 MG/ML IV SOLN
INTRAVENOUS | Status: DC | PRN
  Administered 2017-03-08: 40 mg via INTRAVENOUS

## 2017-03-08 MED ORDER — KETOROLAC TROMETHAMINE 30 MG/ML IJ SOLN
30.0000 mg | Freq: Four times a day (QID) | INTRAMUSCULAR | Status: DC
  Administered 2017-03-08 – 2017-03-09 (×5): 30 mg via INTRAVENOUS
  Filled 2017-03-08 (×8): qty 1

## 2017-03-08 MED ORDER — HYDROCHLOROTHIAZIDE 12.5 MG PO CAPS
12.5000 mg | ORAL_CAPSULE | Freq: Every day | ORAL | Status: DC
  Filled 2017-03-08 (×2): qty 1

## 2017-03-08 MED ORDER — LIDOCAINE HCL (PF) 2 % IJ SOLN
INTRAMUSCULAR | Status: AC
  Filled 2017-03-08: qty 2

## 2017-03-08 MED ORDER — ACETAMINOPHEN 10 MG/ML IV SOLN
INTRAVENOUS | Status: DC | PRN
  Administered 2017-03-08: 1000 mg via INTRAVENOUS

## 2017-03-08 MED ORDER — KETOROLAC TROMETHAMINE 30 MG/ML IJ SOLN: 30.0000 mg | Freq: Four times a day (QID) | INTRAMUSCULAR | Status: DC

## 2017-03-08 MED ORDER — LACTATED RINGERS IV SOLN
INTRAVENOUS | Status: DC
  Administered 2017-03-08 (×3): via INTRAVENOUS

## 2017-03-08 MED ORDER — FAMOTIDINE 20 MG PO TABS
ORAL_TABLET | ORAL | Status: AC
  Administered 2017-03-08: 20 mg via ORAL
  Filled 2017-03-08: qty 1

## 2017-03-08 MED ORDER — SIMETHICONE 80 MG PO CHEW: 80.0000 mg | CHEWABLE_TABLET | Freq: Four times a day (QID) | ORAL | Status: DC | PRN

## 2017-03-08 MED ORDER — FENTANYL CITRATE (PF) 100 MCG/2ML IJ SOLN
25.0000 ug | INTRAMUSCULAR | Status: AC | PRN
  Administered 2017-03-08 (×2): 25 ug via INTRAVENOUS

## 2017-03-08 MED ORDER — CEFOXITIN SODIUM-DEXTROSE 2-2.2 GM-% IV SOLR (PREMIX)
INTRAVENOUS | Status: AC
Start: 2017-03-08 — End: 2017-03-08
  Filled 2017-03-08: qty 50

## 2017-03-08 MED ORDER — LAMOTRIGINE 100 MG PO TABS
400.0000 mg | ORAL_TABLET | Freq: Every day | ORAL | Status: DC
  Administered 2017-03-08: 400 mg via ORAL
  Filled 2017-03-08: qty 4

## 2017-03-08 MED ORDER — MORPHINE SULFATE (PF) 4 MG/ML IV SOLN
2.0000 mg | INTRAVENOUS | Status: DC | PRN
  Administered 2017-03-08 (×3): 2 mg via INTRAVENOUS

## 2017-03-08 MED ORDER — MIDAZOLAM HCL 2 MG/2ML IJ SOLN
INTRAMUSCULAR | Status: DC | PRN
  Administered 2017-03-08: 2 mg via INTRAVENOUS

## 2017-03-08 MED ORDER — MORPHINE SULFATE (PF) 4 MG/ML IV SOLN: 2.0000 mg | INTRAVENOUS | Status: DC | PRN

## 2017-03-08 MED ORDER — ACETAMINOPHEN 325 MG PO TABS
650.0000 mg | ORAL_TABLET | ORAL | Status: DC | PRN
  Filled 2017-03-08: qty 2

## 2017-03-08 MED ORDER — ACETAMINOPHEN NICU IV SYRINGE 10 MG/ML
INTRAVENOUS | Status: AC
  Filled 2017-03-08: qty 1

## 2017-03-08 MED ORDER — ONDANSETRON HCL 4 MG/2ML IJ SOLN
4.0000 mg | Freq: Once | INTRAMUSCULAR | Status: AC
  Administered 2017-03-08: 4 mg via INTRAVENOUS
  Filled 2017-03-08: qty 2

## 2017-03-08 MED ORDER — BUPIVACAINE HCL (PF) 0.5 % IJ SOLN
INTRAMUSCULAR | Status: DC | PRN
  Administered 2017-03-08: 12 mL

## 2017-03-08 MED ORDER — DEXAMETHASONE SODIUM PHOSPHATE 10 MG/ML IJ SOLN
INTRAMUSCULAR | Status: AC
  Filled 2017-03-08: qty 1

## 2017-03-08 MED ORDER — BUPIVACAINE HCL (PF) 0.5 % IJ SOLN
INTRAMUSCULAR | Status: AC
  Filled 2017-03-08: qty 30

## 2017-03-08 MED ORDER — FAMOTIDINE 20 MG PO TABS
20.0000 mg | ORAL_TABLET | Freq: Once | ORAL | Status: AC
  Administered 2017-03-08: 20 mg via ORAL

## 2017-03-08 MED ORDER — KETOROLAC TROMETHAMINE 30 MG/ML IJ SOLN
INTRAMUSCULAR | Status: AC
  Filled 2017-03-08: qty 1

## 2017-03-08 SURGICAL SUPPLY — 56 items
BAG URO DRAIN 2000ML W/SPOUT (MISCELLANEOUS) ×3 IMPLANT
BLADE SURG SZ11 CARB STEEL (BLADE) ×3 IMPLANT
CANISTER SUCT 1200ML W/VALVE (MISCELLANEOUS) ×3 IMPLANT
CATH FOLEY 2WAY  5CC 16FR (CATHETERS) ×2
CATH URTH 16FR FL 2W BLN LF (CATHETERS) ×1 IMPLANT
CHLORAPREP W/TINT 26ML (MISCELLANEOUS) ×3 IMPLANT
DEFOGGER SCOPE WARMER CLEARIFY (MISCELLANEOUS) ×3 IMPLANT
DERMABOND ADVANCED (GAUZE/BANDAGES/DRESSINGS) ×2
DERMABOND ADVANCED .7 DNX12 (GAUZE/BANDAGES/DRESSINGS) ×1 IMPLANT
DEVICE SUTURE ENDOST 10MM (ENDOMECHANICALS) ×3 IMPLANT
DRAPE CAMERA CLOSED 9X96 (DRAPES) ×3 IMPLANT
DRSG TEGADERM 2-3/8X2-3/4 SM (GAUZE/BANDAGES/DRESSINGS) IMPLANT
ENDOSTITCH 0 SINGLE 48 (SUTURE) ×15 IMPLANT
GAUZE SPONGE NON-WVN 2X2 STRL (MISCELLANEOUS) IMPLANT
GLOVE BIO SURGEON STRL SZ 6.5 (GLOVE) ×8 IMPLANT
GLOVE BIO SURGEON STRL SZ7 (GLOVE) ×12 IMPLANT
GLOVE BIO SURGEON STRL SZ8 (GLOVE) ×15 IMPLANT
GLOVE BIO SURGEONS STRL SZ 6.5 (GLOVE) ×4
GLOVE INDICATOR 7.0 STRL GRN (GLOVE) ×6 IMPLANT
GLOVE INDICATOR 8.0 STRL GRN (GLOVE) ×6 IMPLANT
GOWN STRL REUS W/ TWL LRG LVL3 (GOWN DISPOSABLE) ×4 IMPLANT
GOWN STRL REUS W/ TWL XL LVL3 (GOWN DISPOSABLE) ×3 IMPLANT
GOWN STRL REUS W/TWL LRG LVL3 (GOWN DISPOSABLE) ×8
GOWN STRL REUS W/TWL XL LVL3 (GOWN DISPOSABLE) ×6
GRASPER SUT TROCAR 14GX15 (MISCELLANEOUS) ×3 IMPLANT
IRRIGATION STRYKERFLOW (MISCELLANEOUS) ×1 IMPLANT
IRRIGATOR STRYKERFLOW (MISCELLANEOUS) ×3
IV LACTATED RINGERS 1000ML (IV SOLUTION) ×6 IMPLANT
KIT PINK PAD W/HEAD ARE REST (MISCELLANEOUS) ×3
KIT PINK PAD W/HEAD ARM REST (MISCELLANEOUS) ×1 IMPLANT
KIT RM TURNOVER CYSTO AR (KITS) ×3 IMPLANT
LABEL OR SOLS (LABEL) ×3 IMPLANT
MANIPULATOR VCARE LG CRV RETR (MISCELLANEOUS) IMPLANT
MANIPULATOR VCARE MED CRV RETR (MISCELLANEOUS) ×3 IMPLANT
MANIPULATOR VCARE SML CRV RETR (MISCELLANEOUS) IMPLANT
MANIPULATOR VCARE STD CRV RETR (MISCELLANEOUS) IMPLANT
NEEDLE VERESS 14GA 120MM (NEEDLE) ×3 IMPLANT
NS IRRIG 500ML POUR BTL (IV SOLUTION) ×3 IMPLANT
OCCLUDER COLPOPNEUMO (BALLOONS) ×3 IMPLANT
PACK GYN LAPAROSCOPIC (MISCELLANEOUS) ×3 IMPLANT
PAD OB MATERNITY 4.3X12.25 (PERSONAL CARE ITEMS) ×3 IMPLANT
PAD PREP 24X41 OB/GYN DISP (PERSONAL CARE ITEMS) ×3 IMPLANT
SCISSORS METZENBAUM CVD 33 (INSTRUMENTS) ×3 IMPLANT
SET CYSTO W/LG BORE CLAMP LF (SET/KITS/TRAYS/PACK) ×3 IMPLANT
SHEARS HARMONIC ACE PLUS 36CM (ENDOMECHANICALS) ×3 IMPLANT
SLEEVE ENDOPATH XCEL 5M (ENDOMECHANICALS) ×3 IMPLANT
SPONGE VERSALON 2X2 STRL (MISCELLANEOUS)
SUT MNCRL 4-0 (SUTURE) ×2
SUT MNCRL 4-0 27XMFL (SUTURE) ×1
SUT VIC AB 0 CT1 36 (SUTURE) ×3 IMPLANT
SUTURE MNCRL 4-0 27XMF (SUTURE) ×1 IMPLANT
SYR 50ML LL SCALE MARK (SYRINGE) ×3 IMPLANT
SYRINGE 10CC LL (SYRINGE) ×3 IMPLANT
TROCAR ENDO BLADELESS 11MM (ENDOMECHANICALS) ×3 IMPLANT
TROCAR XCEL NON-BLD 5MMX100MML (ENDOMECHANICALS) ×3 IMPLANT
TUBING INSUF HEATED (TUBING) ×3 IMPLANT

## 2017-03-08 NOTE — Progress Notes (Signed)
Dr Tiburcio Pea called due to pain level at 6 out of 10    No nausea  Or dizziness

## 2017-03-08 NOTE — Anesthesia Procedure Notes (Signed)
Procedure Name: Intubation Date/Time: 03/08/2017 10:51 AM Performed by: Allean Found Pre-anesthesia Checklist: Patient identified, Emergency Drugs available, Suction available, Patient being monitored and Timeout performed Patient Re-evaluated:Patient Re-evaluated prior to induction Oxygen Delivery Method: Circle system utilized Preoxygenation: Pre-oxygenation with 100% oxygen Induction Type: IV induction Ventilation: Mask ventilation without difficulty Laryngoscope Size: Mac and 3 Grade View: Grade I Tube type: Oral Tube size: 7.0 mm Number of attempts: 1 Airway Equipment and Method: Stylet Placement Confirmation: ETT inserted through vocal cords under direct vision,  positive ETCO2 and breath sounds checked- equal and bilateral Secured at: 21 cm Tube secured with: Tape Dental Injury: Teeth and Oropharynx as per pre-operative assessment

## 2017-03-08 NOTE — Discharge Instructions (Signed)

## 2017-03-08 NOTE — Progress Notes (Signed)
Day of Surgery Procedure(s) (LRB): HYSTERECTOMY TOTAL LAPAROSCOPIC WITH SALPINGECTOMY (Bilateral)  Subjective: Patient reports lower abd pain and continued drowsiness since surgery, mild nausea.  Concerned about going home too early..    Objective: I have reviewed patient's vital signs, intake and output and medications.  Abd: Min T, ND Incision: clean, dry and intact Extr: no calf T, no edema  Assessment: s/p Procedure(s): HYSTERECTOMY TOTAL LAPAROSCOPIC WITH SALPINGECTOMY (Bilateral): stable  Plan: Observe overnight for pain relief and advance diet as tolerated.    LOS: 0 days    Letitia Libra 03/08/2017, 3:08 PM

## 2017-03-08 NOTE — Anesthesia Post-op Follow-up Note (Signed)
Anesthesia QCDR form completed.        

## 2017-03-08 NOTE — H&P (Signed)
History and Physical Interval Note:  03/08/2017 10:35 AM  Stacey Yates  has presented today for surgery, with the diagnosis of CERVICAL DYSPLASIA  The various methods of treatment have been discussed with the patient and family. After consideration of risks, benefits and other options for treatment, the patient has consented to  Procedure(s): HYSTERECTOMY TOTAL LAPAROSCOPIC WITH SALPINGECTOMY (Bilateral) as a surgical intervention .  The patient's history has been reviewed, patient examined, no change in status, stable for surgery.  Pt has the following beta blocker history-  Not taking Beta Blocker.  I have reviewed the patient's chart and labs.  Questions were answered to the patient's satisfaction.    Letitia Libra

## 2017-03-08 NOTE — Transfer of Care (Signed)
Immediate Anesthesia Transfer of Care Note  Patient: Stacey Yates  Procedure(s) Performed: Procedure(s): HYSTERECTOMY TOTAL LAPAROSCOPIC WITH SALPINGECTOMY (Bilateral)  Patient Location: PACU  Anesthesia Type:General  Level of Consciousness: sedated  Airway & Oxygen Therapy: Patient Spontanous Breathing and Patient connected to face mask oxygen  Post-op Assessment: Report given to RN and Post -op Vital signs reviewed and stable  Post vital signs: Reviewed and stable  Last Vitals:  Vitals:   03/08/17 1011  BP: (!) 144/105  Pulse: 81  Resp: 17  Temp: 36.7 C  SpO2: 100%    Last Pain:  Vitals:   03/08/17 1011  TempSrc: Oral         Complications: No apparent anesthesia complications

## 2017-03-08 NOTE — Op Note (Signed)
Operative Report:  PRE-OP DIAGNOSIS: CERVICAL DYSPLASIA   POST-OP DIAGNOSIS: CERVICAL DYSPLASIA   PROCEDURE: Procedure(s): HYSTERECTOMY TOTAL LAPAROSCOPIC WITH SALPINGECTOMY  SURGEON: Annamarie Major, MD, FACOG  ASSISTANT: Bonney Aid   ANESTHESIA: General endotracheal anesthesia  ESTIMATED BLOOD LOSS: less than 50   SPECIMENS: Uterus, Tubes.  COMPLICATIONS: None  DISPOSITION: stable to PACU  FINDINGS: Intraabdominal adhesions were not noted. Normal ovaries.  PROCEDURE:  The patient was taken to the OR where anesthesia was administed. She was prepped and draped in the normal sterile fashion in the dorsal lithotomy position in the [place] stirrups. A time out was performed. A Graves speculum was inserted, the cervix was grasped with a single tooth tenaculum and the endometrial cavity was sounded. The cervix was progressively dilated to a size 18 [place] with [employer] dilators. A V-Care uterine manipulator was inserted in the usual fashion without incident. Gloves were changed and attention was turned to the abdomen.   An infraumbilical transverse 5mm skin incision was made with the scalpel after local anesthesia applied to the skin. A Veress-step needle was inserted in the usual fashion and confirmed using the hanging drop technique. A pneumoperitoneum was obtained by insufflation of CO2 (opening pressure of ) to . A diagnostic laparoscopy was performed yielding the previously described findings. Attention was turned to the left lower quadrant where after visualization of the inferior epigastric vessels a 5mm skin incision was made with the scalpel. A 5 mm laparoscopic port was inserted. The same procedure was repeated in the right lower quadrant with a 11mm trocar. Attention was turned to the left aspect of the uterus, where after visualization of the ureter, the round ligament was coagulated and transected using the 5mm Harmonic Scapel. The anterior and posterior leafs of the broad  ligament were dissected off as the anterior one was coagulated and transected in a caudal direction towards the cuff of the uterine manipulator.  Attention was then turned to the left fallopian tube which was recognized by visualization of the fimbria. The tube is excised to its attachment to the uterus. The uterine-ovarian ligament and its blood vessels were carefully coagulated and transected using the Harmonic scapel.  Attention was turned to the right aspect of the uterus where the same procedure was performed.  The vesicouterine reflection of the peritoneum was dissected with the harmonic scapel and the bladder flap was created bluntly.  The uterine vessels were coagulated and transected bilaterally using first bipolar cautery and then the harmonic scapel. A 360 degree, circumferential colpotomy was done to completely amputate the uterus with cervix and tubes. Once the specimen was amputated it was delivered through the vagina.   The colpotomy was repaired in a simple interrupted fashion using a 0-Polysorb suture with an endo-stitch device.  Vaginal exam confirms complete closure.  The cavity was copiously irrigated. A survey of the pelvic cavity revealed adequate hemostasis and no injury to bowel, bladder, or ureter.   A diagnostic cystoscopy was performed using saline distension of bladder with no lesions or injuries noted.  Bilateral urine flow from each ureteral orifice is visualized.  At this point the procedure was finalized. All the instruments were removed from the patient's body. Gas was expelled and patient is leveled.  Incisions are closed with skin adhesive.    Patient goes to recovery room in stable condition.  All sponge, instrument, and needle counts are correct x2.     Annamarie Major, MD, [facility], [facility] 03/08/2017  12:31 PM

## 2017-03-08 NOTE — Progress Notes (Signed)
Dr Tiburcio Pea in to see pt

## 2017-03-09 ENCOUNTER — Encounter: Payer: Self-pay | Admitting: Obstetrics & Gynecology

## 2017-03-09 DIAGNOSIS — Z79899 Other long term (current) drug therapy: Secondary | ICD-10-CM | POA: Diagnosis not present

## 2017-03-09 DIAGNOSIS — I10 Essential (primary) hypertension: Secondary | ICD-10-CM | POA: Diagnosis not present

## 2017-03-09 DIAGNOSIS — Z793 Long term (current) use of hormonal contraceptives: Secondary | ICD-10-CM | POA: Diagnosis not present

## 2017-03-09 DIAGNOSIS — N879 Dysplasia of cervix uteri, unspecified: Secondary | ICD-10-CM | POA: Diagnosis not present

## 2017-03-09 DIAGNOSIS — F319 Bipolar disorder, unspecified: Secondary | ICD-10-CM | POA: Diagnosis not present

## 2017-03-09 LAB — TYPE AND SCREEN
ABO/RH(D): O POS
ANTIBODY SCREEN: POSITIVE
UNIT DIVISION: 0
Unit division: 0

## 2017-03-09 LAB — BPAM RBC
Blood Product Expiration Date: 201810142359
Blood Product Expiration Date: 201810142359
UNIT TYPE AND RH: 5100
Unit Type and Rh: 5100

## 2017-03-09 LAB — HEMOGLOBIN: HEMOGLOBIN: 13.8 g/dL (ref 12.0–16.0)

## 2017-03-09 NOTE — Progress Notes (Signed)
Pt given all d/c instructions and understands all.  Rx's given to pt for discharge.  Pt discharged via wheelchair by auxillary.

## 2017-03-09 NOTE — Discharge Summary (Signed)
Gynecology Physician Postoperative Discharge Summary  Patient ID: Stacey Yates MRN: [id number] DOB/AGE: 1975-07-22 41 y.o.  Admit Date: 03/08/2017 Discharge Date: 03/09/2017  Preoperative Diagnoses: Cervical Dysplasia  Procedures: Procedure(s) (LRB): HYSTERECTOMY TOTAL LAPAROSCOPIC WITH SALPINGECTOMY (Bilateral)  Significant Labs: CBC Latest Ref Rng & Units 03/09/2017 03/07/2017 11/10/2012  WBC 3.6 - 11.0 K/uL - 5.1 -  Hemoglobin 12.0 - 16.0 g/dL 03.4 74.2 -  Hematocrit 35.0 - 47.0 % - 43.6 30.2(L)  Platelets 150 - 440 K/uL - 268 -    Hospital Course:  Stacey Yates is a 41 y.o. [id number]  admitted for scheduled surgery.  She underwent the procedures as mentioned above, her operation was uncomplicated. For further details about surgery, please refer to the operative report. Patient had an uncomplicated postoperative course. By time of discharge on POD#1, her pain was controlled on oral pain medications; she was ambulating, voiding without difficulty, tolerating regular diet and passing flatus. She was deemed stable for discharge to home.   Discharge Exam: Blood pressure 124/72, pulse 60, temperature 97.7 F (36.5 C), temperature source Oral, resp. rate 18, height  (1.6 m), weight 141 lb (64 kg), last menstrual period 02/19/2017, SpO2 100 %. General appearance: alert and no distress  Resp: clear to auscultation bilaterally  Cardio: regular rate and rhythm  GI: soft, non-tender; bowel sounds normal; no masses, no organomegaly.  Incision: C/D/I, no erythema, no drainage noted Pelvic: scant blood on pad  Extremities: extremities normal, atraumatic, no cyanosis or edema and Homans sign is negative, no sign of DVT  Discharged Condition: Stable  Disposition: 01-Home or Self Care  Discharge Instructions    Call MD for:  persistant nausea and vomiting    Complete by:  As directed    Call MD for:  redness, tenderness, or signs of infection (pain, swelling, redness, odor or  green/yellow discharge around incision site)    Complete by:  As directed    Call MD for:  severe uncontrolled pain    Complete by:  As directed    Call MD for:  temperature >100.4    Complete by:  As directed    Change dressing (specify)    Complete by:  As directed    Dressing change: remove any dressings tomorrow   Diet general    Complete by:  As directed    Discharge instructions    Complete by:  As directed    Resume activities according to discharge instruction sheets   Increase activity slowly    Complete by:  As directed      Allergies as of 03/09/2017   No Known Allergies     Medication List    STOP taking these medications   LO LOESTRIN FE 1 MG-10 MCG / 10 MCG tablet Generic drug:  Norethindrone-Ethinyl Estradiol-Fe Biphas     TAKE these medications   amphetamine-dextroamphetamine 10 MG tablet Commonly known as:  ADDERALL Take 10 mg by mouth daily with breakfast. As needed   cloNIDine 0.2 mg/24hr patch Commonly known as:  CATAPRES - Dosed in mg/24 hr Place 0.2 mg onto the skin once a week.   lamoTRIgine 200 MG tablet Commonly known as:  LAMICTAL Take 400 mg by mouth at bedtime. hs   lisinopril-hydrochlorothiazide 20-12.5 MG tablet Commonly known as:  PRINZIDE,ZESTORETIC Take 1 tablet by mouth daily. TAKING THIS FOR HOT FLASHES   oxyCODONE-acetaminophen 5-325 MG tablet Commonly known as:  PERCOCET Take 1 tablet by mouth every 4 (four) hours as needed for moderate pain  or severe pain.   valACYclovir 500 MG tablet Commonly known as:  VALTREX Take 500 mg by mouth daily. As needed            Discharge Care Instructions        Start     Ordered   03/09/17 0000  Discharge instructions    Comments:  Resume activities according to discharge instruction sheets   03/09/17 0733   03/09/17 0000  Increase activity slowly     03/09/17 0733   03/09/17 0000  Diet general     03/09/17 0733   03/09/17 0000  Change dressing (specify)    Comments:   Dressing change: remove any dressings tomorrow   03/09/17 0733   03/09/17 0000  Call MD for:  temperature >100.4     03/09/17 0733   03/09/17 0000  Call MD for:  persistant nausea and vomiting     03/09/17 0733   03/09/17 0000  Call MD for:  severe uncontrolled pain     03/09/17 0733   03/09/17 0000  Call MD for:  redness, tenderness, or signs of infection (pain, swelling, redness, odor or green/yellow discharge around incision site)     03/09/17 0733   03/08/17 0000  oxyCODONE-acetaminophen (PERCOCET) 5-325 MG tablet  Every 4 hours PRN     03/08/17 1230     Follow-up Information    Nadara Mustard, MD. Go in 2 week(s).   Specialty:  Obstetrics and Gynecology Contact information: [address] [place] [address] [phone]           Annamarie Major, MD

## 2017-03-12 ENCOUNTER — Encounter: Payer: Self-pay | Admitting: Obstetrics & Gynecology

## 2017-03-12 ENCOUNTER — Ambulatory Visit (INDEPENDENT_AMBULATORY_CARE_PROVIDER_SITE_OTHER): Payer: BLUE CROSS/BLUE SHIELD | Admitting: Obstetrics and Gynecology

## 2017-03-12 ENCOUNTER — Encounter: Payer: Self-pay | Admitting: Obstetrics and Gynecology

## 2017-03-12 VITALS — BP 118/74 | Ht 63.0 in | Wt 145.0 lb

## 2017-03-12 DIAGNOSIS — N9089 Other specified noninflammatory disorders of vulva and perineum: Secondary | ICD-10-CM | POA: Diagnosis not present

## 2017-03-12 NOTE — Progress Notes (Signed)
   Postoperative Follow-up Patient presents post op from an uncomplicated TLH/BS 4days ago for persistent cervical dysplasia..  Subjective: Eating a regular diet without difficulty. Pain is controlled with current analgesics. Medications being used: naproxen (OTC).  Activity: slowly increasing.  She presents today due to extreme itching in her external vulvar area and in her peri-anal area. She has tried petroleum jelly with no benefit. She denies fevers, chills, nausea, emesis.  She denies vaginal bleeding and any vaginal symptoms.   Objective: Vitals:   03/12/17 1406  BP: 118/74   Vital Signs: BP 118/74   Ht  (1.6 m)   Wt 145 lb (65.8 kg)   LMP 02/19/2017 (Exact Date)   BMI 25.69 kg/m  Constitutional: Well nourished, well developed female in no acute distress.  HEENT: normal Skin: Warm and dry.  Extremity: no edema  Abdomen: Soft, non-tender, normal bowel sounds; no bruits, organomegaly or masses. clean, dry, intact and all incision sites clean, dry, and intact  Pelvic exam: (female chaperone present) is not limited by body habitus Vulva noted to have erythema on right labium minus extending down along the perineum to the perianal area.  Introitus without erythema. Vaginal exam deferred due to recent surgery and no complaints from this area.    KOH wet prep: negative  Assessment: 41 y.o. s/p TLH/BS with vaginal irritative symptoms  Plan: Discussed conservative measures to treat her symptoms. No evidence of infection.  Keep follow up with Dr. Tiburcio Pea, as previously scheduled.   Thomasene Mohair 03/12/2017, 2:15 PM

## 2017-03-13 LAB — SURGICAL PATHOLOGY

## 2017-03-13 NOTE — Telephone Encounter (Signed)
Prob not.  If develops discharge then can examine and test for etiology.

## 2017-03-21 ENCOUNTER — Encounter: Payer: Self-pay | Admitting: Obstetrics & Gynecology

## 2017-03-21 ENCOUNTER — Ambulatory Visit (INDEPENDENT_AMBULATORY_CARE_PROVIDER_SITE_OTHER): Payer: BLUE CROSS/BLUE SHIELD | Admitting: Obstetrics & Gynecology

## 2017-03-21 VITALS — BP 130/90 | HR 71 | Ht 63.0 in | Wt 146.0 lb

## 2017-03-21 DIAGNOSIS — N870 Mild cervical dysplasia: Secondary | ICD-10-CM

## 2017-03-21 NOTE — Progress Notes (Signed)
  Postoperative Follow-up Patient presents post op from [facility] BS for cervical dysplasia, 2 weeks ago.  Subjective: Patient reports marked improvement in her preop symptoms. Eating a regular diet without difficulty. The patient is not having any pain.  Activity: normal activities of daily living. Patient reports vaginal sx's of Irregular bleeding- spotting at times  Objective: BP 130/90   Pulse 71   Ht  (1.6 m)   Wt 146 lb (66.2 kg)   LMP 02/19/2017 (Exact Date)   BMI 25.86 kg/m  Physical Exam  Constitutional: She is oriented to person, place, and time. She appears well-developed and well-nourished. No distress.  Cardiovascular: Normal rate.   Pulmonary/Chest: Effort normal.  Abdominal: Soft. She exhibits no distension. There is no tenderness.  Incision Healing Well   Musculoskeletal: Normal range of motion.  Neurological: She is alert and oriented to person, place, and time. No cranial nerve deficit.  Skin: Skin is warm and dry.  Psychiatric: She has a normal mood and affect.    Assessment: s/p :  total laparoscopic hysterectomy with bilateral salpingectomy stable  Plan: Patient has done well after surgery with no apparent complications.  I have discussed the post-operative course to date, and the expected progress moving forward.  The patient understands what complications to be concerned about.  I will see the patient in routine follow up, or sooner if needed.    Activity plan: No heavy lifting.Pelvic rest.  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

## 2017-03-22 NOTE — Anesthesia Preprocedure Evaluation (Signed)
Anesthesia Evaluation  Patient identified by MRN, date of birth, ID band Patient awake    Reviewed: Allergy & Precautions, H&P , NPO status , Patient's Chart, lab work & pertinent test results, reviewed documented beta blocker date and time   Airway Mallampati: II  TM Distance: >3 FB Neck ROM: full    Dental  (+) Teeth Intact   Pulmonary neg pulmonary ROS,    Pulmonary exam normal        Cardiovascular Exercise Tolerance: Good hypertension, On Medications negative cardio ROS Normal cardiovascular exam Rhythm:regular Rate:Normal     Neuro/Psych PSYCHIATRIC DISORDERS negative neurological ROS  negative psych ROS   GI/Hepatic negative GI ROS, Neg liver ROS,   Endo/Other  negative endocrine ROS  Renal/GU negative Renal ROS  negative genitourinary   Musculoskeletal   Abdominal   Peds  Hematology negative hematology ROS (+)   Anesthesia Other Findings Past Medical History: No date: Bipolar disorder (HCC) 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 No date: Hypertension     Comment:  WITH PREGNANCY-TOOK BP MEDS X 1 YEAR AFTER PREGNANCY AND              BP BECAME CONTROLLED AND HAS NOT BEEN ON BP MEDS RECENTLY Past Surgical History: No date: BREAST SURGERY; Right No date: DILATION AND CURETTAGE OF UTERUS 06/20/2016: LEEP; N/A     Comment:  Procedure: LOOP ELECTROSURGICAL EXCISION PROCEDURE               (LEEP);  Surgeon: Nadara Mustard, MD;  Location: [facility]               ORS;  Service: Gynecology;  Laterality: N/A; 03/08/2017: TOTAL LAPAROSCOPIC HYSTERECTOMY WITH SALPINGECTOMY;  Bilateral     Comment:  Procedure: HYSTERECTOMY TOTAL LAPAROSCOPIC WITH               SALPINGECTOMY;  Surgeon: Nadara Mustard, MD;  Location:              [facility] ORS;  Service: Gynecology;  Laterality: Bilateral; No date: TUBAL LIGATION BMI    Body Mass Index:  24.98 kg/m     Reproductive/Obstetrics negative OB ROS                              Anesthesia Physical Anesthesia Plan  ASA: II  Anesthesia Plan: General ETT   Post-op Pain Management:    Induction:   PONV Risk Score and Plan:   Airway Management Planned:   Additional Equipment:   Intra-op Plan:   Post-operative Plan:   Informed Consent: I have reviewed the patients History and Physical, chart, labs and discussed the procedure including the risks, benefits and alternatives for the proposed anesthesia with the patient or authorized representative who has indicated his/her understanding and acceptance.   Dental Advisory Given  Plan Discussed with: CRNA  Anesthesia Plan Comments:         Anesthesia Quick Evaluation

## 2017-03-22 NOTE — Anesthesia Postprocedure Evaluation (Signed)
Anesthesia Post Note  Patient: Stacey Yates  Procedure(s) Performed: HYSTERECTOMY TOTAL LAPAROSCOPIC WITH SALPINGECTOMY (Bilateral )  Patient location during evaluation: PACU Anesthesia Type: General Level of consciousness: awake and alert Pain management: pain level controlled Vital Signs Assessment: post-procedure vital signs reviewed and stable Respiratory status: spontaneous breathing, nonlabored ventilation, respiratory function stable and patient connected to nasal cannula oxygen Cardiovascular status: blood pressure returned to baseline and stable Postop Assessment: no apparent nausea or vomiting Anesthetic complications: no     Last Vitals:  Vitals:   03/09/17 0700 03/09/17 1144  BP: 129/74 107/62  Pulse: 62 87  Resp: 17 17  Temp: 36.7 C 36.7 C  SpO2: 100%     Last Pain:  Vitals:   03/09/17 1246  TempSrc:   PainSc: 1                  Yevette Edwards

## 2017-03-26 ENCOUNTER — Encounter: Payer: Self-pay | Admitting: Obstetrics & Gynecology

## 2017-04-03 ENCOUNTER — Encounter: Payer: Self-pay | Admitting: Obstetrics & Gynecology

## 2017-04-04 ENCOUNTER — Encounter: Payer: Self-pay | Admitting: Obstetrics & Gynecology

## 2017-04-04 ENCOUNTER — Ambulatory Visit (INDEPENDENT_AMBULATORY_CARE_PROVIDER_SITE_OTHER): Payer: BLUE CROSS/BLUE SHIELD | Admitting: Obstetrics & Gynecology

## 2017-04-04 VITALS — BP 130/80 | HR 63 | Ht 63.0 in | Wt 146.0 lb

## 2017-04-04 DIAGNOSIS — T8131XA Disruption of external operation (surgical) wound, not elsewhere classified, initial encounter: Secondary | ICD-10-CM

## 2017-04-04 NOTE — Progress Notes (Signed)
  Postoperative Follow-up Patient presents [facility], 1 month ago.  Subjective: Patient reports daily bleeding that has worsened for two weeks now.  No sex or other strenuous activity.  No pain.  Objective: BP 130/80   Pulse 63   Ht 5\' 3"  (1.6 m)   Wt 146 lb (66.2 kg)   LMP 02/19/2017 (Exact Date)   BMI 25.86 kg/m  Physical Exam  Constitutional: She is oriented to person, place, and time. She appears well-developed and well-nourished. No distress.  Genitourinary: Rectum normal and vagina normal. Pelvic exam was performed with patient supine. There is no rash, tenderness or lesion on the right labia. There is no rash, tenderness or lesion on the left labia. No erythema or bleeding in the vagina. Right adnexum does not display mass and does not display tenderness. Left adnexum does not display mass and does not display tenderness.  Genitourinary Comments: Cervix and uterus absent. Vaginal cuff has small defect with contact bleeding noted and pain to touch, no deep defect  Cardiovascular: Normal rate.   Pulmonary/Chest: Effort normal.  Abdominal: Soft. She exhibits no distension. There is no tenderness.  Incision healing well.  Musculoskeletal: Normal range of motion.  Neurological: She is alert and oriented to person, place, and time. No cranial nerve deficit.  Skin: Skin is warm and dry.  Psychiatric: She has a normal mood and affect.   Assessment: s/p :  total laparoscopic hysterectomy with bilateral salpingectomy with bleeding and discomfort on exam of vaginal cuff; small area of vaginal cuff dehiscence  Plan: Patient has done well after surgery with no apparent complications.  I have discussed the post-operative course to date, and the expected progress moving forward.  The patient understands what complications to be concerned about.  I will see the patient in routine follow up, or sooner if needed.    Activity plan: No heavy lifting. Pelvic  rest. Option of surgery to repair (vag only procedure) vs waiting 6-7 weeks from surgery to allow to heal.  To decide.  Stacey Yates 04/04/2017, 4:10 PM

## 2017-04-05 ENCOUNTER — Telehealth: Payer: Self-pay | Admitting: Obstetrics & Gynecology

## 2017-04-05 NOTE — Telephone Encounter (Signed)
Patient is aware to arrive @ Pre-admit Testing tomorrow morning at 9:30am, and NPO after midnight.

## 2017-04-05 NOTE — Telephone Encounter (Signed)
Surgery Booking Request Patient Full Name:   MRN: [id number]  DOB: [birth year]  Surgeon: [name] Paul Leahann Lempke, MD  Requested Surgery Date and Time: [phone] Primary Diagnosis AND Code: Vaginal dehiscence Secondary Diagnosis and Code: Vaginal Bleeding Surgical Procedure: Exam under anesthesia, Repair vaginal separation L&D Notification: No Admission Status: same day surgery Length of Surgery: 30 min Special Case Needs: vag tray H&P: done Phone Interview???: yes Interpreter: Language:  Medical Clearance: no Special Scheduling Instructions: no

## 2017-04-05 NOTE — Telephone Encounter (Signed)
Pt is calling to confirm to surgery with Dr. Tiburcio PeaHarris for tomorrow. Please advise.
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
## 2017-04-05 NOTE — Telephone Encounter (Signed)
Per Heather @ Pre-admit Testing, it is okay for the patient to take her lamictal prescription tonight. Patient is aware.

## 2017-04-06 ENCOUNTER — Ambulatory Visit: Payer: BLUE CROSS/BLUE SHIELD | Admitting: Certified Registered Nurse Anesthetist

## 2017-04-06 ENCOUNTER — Ambulatory Visit
Admission: RE | Admit: 2017-04-06 | Discharge: 2017-04-06 | Disposition: A | Discharge status: Home / Self Care | Payer: BLUE CROSS/BLUE SHIELD | Source: Ambulatory Visit | Attending: Obstetrics & Gynecology | Admitting: Obstetrics & Gynecology

## 2017-04-06 ENCOUNTER — Encounter
Admission: RE | Disposition: A | Discharge status: Home / Self Care | Payer: Self-pay | Source: Ambulatory Visit | Attending: Obstetrics & Gynecology

## 2017-04-06 ENCOUNTER — Encounter: Payer: Self-pay | Admitting: *Deleted

## 2017-04-06 DIAGNOSIS — I10 Essential (primary) hypertension: Secondary | ICD-10-CM | POA: Insufficient documentation

## 2017-04-06 DIAGNOSIS — N898 Other specified noninflammatory disorders of vagina: Secondary | ICD-10-CM | POA: Insufficient documentation

## 2017-04-06 DIAGNOSIS — Z79899 Other long term (current) drug therapy: Secondary | ICD-10-CM | POA: Diagnosis not present

## 2017-04-06 DIAGNOSIS — T8131XA Disruption of external operation (surgical) wound, not elsewhere classified, initial encounter: Secondary | ICD-10-CM | POA: Diagnosis present

## 2017-04-06 HISTORY — PX: REPAIR VAGINAL CUFF: SHX6067

## 2017-04-06 SURGERY — EXAM UNDER ANESTHESIA
Anesthesia: Choice | Site: Vagina | Wound class: Clean Contaminated

## 2017-04-06 MED ORDER — LIDOCAINE HCL (CARDIAC) 20 MG/ML IV SOLN
INTRAVENOUS | Status: DC | PRN
  Administered 2017-04-06: 100 mg via INTRAVENOUS

## 2017-04-06 MED ORDER — FENTANYL CITRATE (PF) 100 MCG/2ML IJ SOLN
INTRAMUSCULAR | Status: AC
  Filled 2017-04-06: qty 2

## 2017-04-06 MED ORDER — PROPOFOL 10 MG/ML IV BOLUS
INTRAVENOUS | Status: DC | PRN
  Administered 2017-04-06: 200 mg via INTRAVENOUS

## 2017-04-06 MED ORDER — LACTATED RINGERS IV SOLN
INTRAVENOUS | Status: DC
  Administered 2017-04-06: 10:00:00 via INTRAVENOUS

## 2017-04-06 MED ORDER — OXYCODONE-ACETAMINOPHEN 5-325 MG PO TABS
ORAL_TABLET | ORAL | Status: AC
  Filled 2017-04-06: qty 1

## 2017-04-06 MED ORDER — KETOROLAC TROMETHAMINE 30 MG/ML IJ SOLN
INTRAMUSCULAR | Status: AC
  Administered 2017-04-06: 30 mg via INTRAVENOUS
  Filled 2017-04-06: qty 1

## 2017-04-06 MED ORDER — KETOROLAC TROMETHAMINE 30 MG/ML IJ SOLN
30.0000 mg | Freq: Four times a day (QID) | INTRAMUSCULAR | Status: DC
  Administered 2017-04-06: 30 mg via INTRAVENOUS
  Filled 2017-04-06: qty 1

## 2017-04-06 MED ORDER — LABETALOL HCL 5 MG/ML IV SOLN
INTRAVENOUS | Status: AC
  Administered 2017-04-06: 5 mg via INTRAVENOUS
  Filled 2017-04-06: qty 4

## 2017-04-06 MED ORDER — DEXAMETHASONE SODIUM PHOSPHATE 10 MG/ML IJ SOLN
INTRAMUSCULAR | Status: AC
  Filled 2017-04-06: qty 1

## 2017-04-06 MED ORDER — LIDOCAINE HCL (PF) 2 % IJ SOLN
INTRAMUSCULAR | Status: AC
  Filled 2017-04-06: qty 10

## 2017-04-06 MED ORDER — MIDAZOLAM HCL 2 MG/2ML IJ SOLN
INTRAMUSCULAR | Status: DC | PRN
  Administered 2017-04-06: 2 mg via INTRAVENOUS

## 2017-04-06 MED ORDER — ONDANSETRON HCL 4 MG/2ML IJ SOLN
INTRAMUSCULAR | Status: DC | PRN
  Administered 2017-04-06: 4 mg via INTRAVENOUS

## 2017-04-06 MED ORDER — FENTANYL CITRATE (PF) 100 MCG/2ML IJ SOLN
INTRAMUSCULAR | Status: DC | PRN
  Administered 2017-04-06: 50 ug via INTRAVENOUS

## 2017-04-06 MED ORDER — LABETALOL HCL 5 MG/ML IV SOLN
5.0000 mg | INTRAVENOUS | Status: DC | PRN
  Administered 2017-04-06: 5 mg via INTRAVENOUS

## 2017-04-06 MED ORDER — GLYCOPYRROLATE 0.2 MG/ML IJ SOLN
INTRAMUSCULAR | Status: AC
  Filled 2017-04-06: qty 1

## 2017-04-06 MED ORDER — MIDAZOLAM HCL 2 MG/2ML IJ SOLN
INTRAMUSCULAR | Status: AC
  Filled 2017-04-06: qty 2

## 2017-04-06 MED ORDER — DEXAMETHASONE SODIUM PHOSPHATE 10 MG/ML IJ SOLN
INTRAMUSCULAR | Status: DC | PRN
  Administered 2017-04-06: 10 mg via INTRAVENOUS

## 2017-04-06 MED ORDER — PROPOFOL 10 MG/ML IV BOLUS
INTRAVENOUS | Status: AC
  Filled 2017-04-06: qty 20

## 2017-04-06 MED ORDER — FENTANYL CITRATE (PF) 100 MCG/2ML IJ SOLN
INTRAMUSCULAR | Status: AC
  Administered 2017-04-06: 25 ug via INTRAVENOUS
  Filled 2017-04-06: qty 2

## 2017-04-06 MED ORDER — FENTANYL CITRATE (PF) 100 MCG/2ML IJ SOLN
25.0000 ug | INTRAMUSCULAR | Status: DC | PRN
  Administered 2017-04-06 (×3): 25 ug via INTRAVENOUS

## 2017-04-06 MED ORDER — OXYCODONE-ACETAMINOPHEN 5-325 MG PO TABS
1.0000 | ORAL_TABLET | Freq: Once | ORAL | Status: AC
  Administered 2017-04-06: 1 via ORAL

## 2017-04-06 SURGICAL SUPPLY — 19 items
CATH ROBINSON RED A/P 16FR (CATHETERS) ×3 IMPLANT
GLOVE BIO SURGEON STRL SZ8 (GLOVE) ×3 IMPLANT
GOWN STRL REUS W/ TWL LRG LVL3 (GOWN DISPOSABLE) ×1 IMPLANT
GOWN STRL REUS W/ TWL XL LVL3 (GOWN DISPOSABLE) ×1 IMPLANT
GOWN STRL REUS W/TWL LRG LVL3 (GOWN DISPOSABLE) ×2
GOWN STRL REUS W/TWL XL LVL3 (GOWN DISPOSABLE) ×2
NS IRRIG 500ML POUR BTL (IV SOLUTION) ×3 IMPLANT
PACK DNC HYST (MISCELLANEOUS) ×3 IMPLANT
PAD OB MATERNITY 4.3X12.25 (PERSONAL CARE ITEMS) ×3 IMPLANT
PAD PREP 24X41 OB/GYN DISP (PERSONAL CARE ITEMS) ×3 IMPLANT
STRAP SAFETY BODY (MISCELLANEOUS) ×3 IMPLANT
SUT VIC AB 0 CT1 27 (SUTURE) ×2
SUT VIC AB 0 CT1 27XCR 8 STRN (SUTURE) ×1 IMPLANT
SUT VIC AB 0 CT1 36 (SUTURE) ×3 IMPLANT
SUT VIC AB 2-0 CT1 (SUTURE) ×3 IMPLANT
SUT VIC AB 2-0 CT1 27 (SUTURE) ×2
SUT VIC AB 2-0 CT1 TAPERPNT 27 (SUTURE) ×1 IMPLANT
SYR BULB IRRIG 60ML STRL (SYRINGE) ×3 IMPLANT
TOWEL OR 17X26 4PK STRL BLUE (TOWEL DISPOSABLE) ×3 IMPLANT

## 2017-04-06 NOTE — Op Note (Signed)
Operative Note:  PRE-OP DIAGNOSIS: vaginal wall seperation   POST-OP DIAGNOSIS: vaginal wall seperation   PROCEDURE: Procedure(s): EXAM UNDER ANESTHESIA REPAIR VAGINAL CUFF  SURGEON: Annamarie MajorPaul Marque Rademaker, MD, FACOG   ANESTHESIA: General endotracheal anesthesia  ESTIMATED BLOOD LOSS: Minimal  SPECIMENS: none  COMPLICATIONS: None  DISPOSITION: stable to PACU  FINDINGS: Mucosal separation at vaginal cuff; peritoneum intact  PROCEDURE:  Patient was taken to the OR where she was placed in dorsal lithotomy in Allen stirrups. She was prepped and draped in the usual sterile fashion. A timeout was performed.  A speculum was placed inside the vagina. Above findings noted.  The mucosal edges surrounding the defect were indentified to ensure healthy skin edges.    A 0 vicryl suture was used first to approximate the submucosal tissues in an interrupted faschion and then a 2-0 Vicryl Suture was used to close the vaginal mucosa in a continuous running fashion.  No bleeding noted, appearance of complete closured visualized.  All instrument needle and lap counts were correct x 2. Patient was awakened taken to recovery room in stable condition.  Annamarie MajorPaul Terion Hedman, MD, Merlinda FrederickFACOG [facility], [facility] 04/06/2017  12:36 PM

## 2017-04-06 NOTE — Anesthesia Post-op Follow-up Note (Signed)
Anesthesia QCDR form completed.        

## 2017-04-06 NOTE — H&P (Signed)
History and Physical Interval Note:  04/06/2017 11:00 AM  Stacey ScaleAngela M Trzcinski  has presented today for surgery, with the diagnosis of vaginal wall seperation  The various methods of treatment have been discussed with the patient and family. After consideration of risks, benefits and other options for treatment, the patient has consented to  Procedure(s): EXAM UNDER ANESTHESIA (N/A) REPAIR VAGINAL CUFF (N/A) as a surgical intervention .  The patient's history has been reviewed, patient examined, no change in status, stable for surgery.  Pt has the following beta blocker history-  Not taking Beta Blocker.  I have reviewed the patient's chart and labs.  Questions were answered to the patient's satisfaction.    [name]

## 2017-04-06 NOTE — Transfer of Care (Signed)
Immediate Anesthesia Transfer of Care Note  Patient: Stacey Yates  Procedure(s) Performed: Francia GreavesEXAM UNDER ANESTHESIA (N/A Vagina ) REPAIR VAGINAL CUFF (N/A Vagina )  Patient Location: PACU  Anesthesia Type:General  Level of Consciousness: awake  Airway & Oxygen Therapy: Patient Spontanous Breathing and Patient connected to face mask oxygen  Post-op Assessment: Report given to RN and Post -op Vital signs reviewed and stable  Post vital signs: Reviewed and stable  Last Vitals:  Vitals:   04/06/17 1006 04/06/17 1247  BP: (!) 127/109   Pulse: 93   Resp: 18   Temp: 36.7 C (P) 36.7 C  SpO2: 100%     Last Pain:  Vitals:   04/06/17 1006  TempSrc: Tympanic         Complications: No apparent anesthesia complications

## 2017-04-06 NOTE — Discharge Instructions (Signed)
Recovery as before    AMBULATORY SURGERY  DISCHARGE INSTRUCTIONS   1) The drugs that you were given will stay in your system until tomorrow so for the next 24 hours you should not:  A) Drive an automobile B) Make any legal decisions C) Drink any alcoholic beverage   2) You may resume regular meals tomorrow.  Today it is better to start with liquids and gradually work up to solid foods.  You may eat anything you prefer, but it is better to start with liquids, then soup and crackers, and gradually work up to solid foods.  3) Please notify your doctor immediately if you have any unusual bleeding, trouble breathing, redness and pain at the surgery site, drainage, fever, or pain not relieved by medication.  4) Additional Instructions:   Please contact your physician with any problems or Same Day Surgery at [phone], Monday through Friday 6 am to 4 pm, or [place] at [facility] Main number at [phone].

## 2017-04-06 NOTE — Anesthesia Procedure Notes (Signed)
Procedure Name: LMA Insertion Date/Time: 04/06/2017 12:02 PM Performed by: Ginger CarneMICHELET, Briunna Leicht Pre-anesthesia Checklist: Patient identified, Emergency Drugs available, Suction available, Patient being monitored and Timeout performed Patient Re-evaluated:Patient Re-evaluated prior to induction Oxygen Delivery Method: Circle system utilized Preoxygenation: Pre-oxygenation with 100% oxygen Induction Type: IV induction LMA: LMA inserted LMA Size: 3.5 Tube type: Oral Number of attempts: 1 Placement Confirmation: positive ETCO2 and breath sounds checked- equal and bilateral Tube secured with: Tape Dental Injury: Teeth and Oropharynx as per pre-operative assessment

## 2017-04-06 NOTE — Anesthesia Postprocedure Evaluation (Signed)
Anesthesia Post Note  Patient: Stacey Yates  Procedure(s) Performed: Jasmine December UNDER ANESTHESIA (N/A Vagina ) REPAIR VAGINAL CUFF (N/A Vagina )  Patient location during evaluation: PACU Anesthesia Type: MAC Level of consciousness: awake and alert Pain management: pain level controlled Vital Signs Assessment: post-procedure vital signs reviewed and stable Respiratory status: spontaneous breathing, nonlabored ventilation, respiratory function stable and patient connected to nasal cannula oxygen Cardiovascular status: stable and blood pressure returned to baseline Postop Assessment: no apparent nausea or vomiting Anesthetic complications: no     Last Vitals:  Vitals:   04/06/17 1317 04/06/17 1322  BP: (!) 126/100 (!) 131/96  Pulse: 88 80  Resp: 13 13  Temp:    SpO2: 99% 96%    Last Pain:  Vitals:   04/06/17 1322  TempSrc:   PainSc: 3                  Molli Barrows

## 2017-04-06 NOTE — Anesthesia Preprocedure Evaluation (Signed)
Anesthesia Evaluation  Patient identified by MRN, date of birth, ID band Patient awake    Reviewed: Allergy & Precautions, H&P , NPO status , Patient's Chart, lab work & pertinent test results, reviewed documented beta blocker date and time   Airway Mallampati: II  TM Distance: >3 FB Neck ROM: full    Dental  (+) Teeth Intact   Pulmonary neg pulmonary ROS,    Pulmonary exam normal        Cardiovascular Exercise Tolerance: Good hypertension, negative cardio ROS Normal cardiovascular exam Rate:Normal     Neuro/Psych PSYCHIATRIC DISORDERS negative neurological ROS  negative psych ROS   GI/Hepatic negative GI ROS, Neg liver ROS,   Endo/Other  negative endocrine ROS  Renal/GU negative Renal ROS  negative genitourinary   Musculoskeletal   Abdominal   Peds  Hematology negative hematology ROS (+)   Anesthesia Other Findings   Reproductive/Obstetrics negative OB ROS                             Anesthesia Physical Anesthesia Plan  ASA: II  Anesthesia Plan: General LMA   Post-op Pain Management:    Induction:   PONV Risk Score and Plan:   Airway Management Planned:   Additional Equipment:   Intra-op Plan:   Post-operative Plan:   Informed Consent: I have reviewed the patients History and Physical, chart, labs and discussed the procedure including the risks, benefits and alternatives for the proposed anesthesia with the patient or authorized representative who has indicated his/her understanding and acceptance.     Plan Discussed with: CRNA  Anesthesia Plan Comments:         Anesthesia Quick Evaluation

## 2017-04-17 ENCOUNTER — Encounter: Payer: Self-pay | Admitting: Obstetrics & Gynecology

## 2017-04-17 ENCOUNTER — Ambulatory Visit (INDEPENDENT_AMBULATORY_CARE_PROVIDER_SITE_OTHER): Payer: BLUE CROSS/BLUE SHIELD | Admitting: Obstetrics & Gynecology

## 2017-04-17 VITALS — BP 138/90 | HR 78 | Ht 63.0 in | Wt 147.0 lb

## 2017-04-17 DIAGNOSIS — T8131XD Disruption of external operation (surgical) wound, not elsewhere classified, subsequent encounter: Secondary | ICD-10-CM

## 2017-04-17 MED ORDER — CLONIDINE 0.2 MG/24HR TD PTWK: 0.2000 mg | MEDICATED_PATCH | TRANSDERMAL | 12 refills | Status: DC

## 2017-04-17 MED ORDER — EFLORNITHINE HCL 13.9 % EX CREA: TOPICAL_CREAM | CUTANEOUS | 3 refills | Status: DC

## 2017-04-17 NOTE — Progress Notes (Signed)
  [facility] Follow[facility]up [facility] cuff for cercixal dysplasia and bleeding, and then dehiscence, 2 weeks ago.  Subjective: Patient reports marked improvement in her preop symptoms. Eating a regular diet without difficulty. The patient is not having any pain.  Activity: normal activities of daily living. Patient reports vaginal sx's of None  Objective: BP 138/90   Pulse 78   Ht 5\' 3"  (1.6 m)   Wt 147 lb (66.7 kg)   LMP 02/19/2017 (Exact Date)   BMI 26.04 kg/m  Physical Exam  Constitutional: She is oriented to person, place, and time. She appears well-developed and well-nourished. No distress.  Cardiovascular: Normal rate.  Pulmonary/Chest: Effort normal.  Abdominal: Soft. She exhibits no distension. There is no tenderness.  Incision Healing Well   Musculoskeletal: Normal range of motion.  Neurological: She is alert and oriented to person, place, and time. No cranial nerve deficit.  Skin: Skin is warm and dry.  Psychiatric: She has a normal mood and affect.    Assessment: s/p :  Repair after TLH stable  Plan: Patient has done well after surgery with no apparent complications.  I have discussed the post-operative course to date, and the expected progress moving forward.  The patient understands what complications to be concerned about.  I will see the patient in routine follow up, or sooner if needed.    Activity plan: No restriction.Pelvic rest until next exam  Clonidine for hot flashes, Vaniqa for hirsutism reordered today  [name] Paul Glendia Olshefski 04/17/2017, 10:39 AM

## 2017-05-14 ENCOUNTER — Ambulatory Visit: Payer: BLUE CROSS/BLUE SHIELD | Admitting: Obstetrics & Gynecology

## 2017-05-16 ENCOUNTER — Ambulatory Visit: Payer: BLUE CROSS/BLUE SHIELD | Admitting: Obstetrics & Gynecology

## 2017-06-06 ENCOUNTER — Encounter: Payer: Self-pay | Admitting: Obstetrics & Gynecology

## 2017-06-06 ENCOUNTER — Ambulatory Visit (INDEPENDENT_AMBULATORY_CARE_PROVIDER_SITE_OTHER): Payer: BLUE CROSS/BLUE SHIELD | Admitting: Obstetrics & Gynecology

## 2017-06-06 VITALS — BP 138/90 | HR 89 | Ht 63.0 in | Wt 146.0 lb

## 2017-06-06 DIAGNOSIS — T8131XD Disruption of external operation (surgical) wound, not elsewhere classified, subsequent encounter: Secondary | ICD-10-CM

## 2017-06-06 MED ORDER — SPIRONOLACTONE 100 MG PO TABS: 100.0000 mg | ORAL_TABLET | Freq: Two times a day (BID) | ORAL | 11 refills | Status: DC

## 2017-06-06 NOTE — Progress Notes (Signed)
  Postoperative Follow-up [facility] then vag repair, now 6 and 12 weeks Post Op.   Subjective: Patient reports marked improvement in her preop symptoms. Eating a regular diet without difficulty. The patient is not having any pain.  Activity: normal activities of daily living. Patient reports vaginal sx's of No bleeding.  One time had sex w no bleeding then as well.  Objective: LMP 02/19/2017 (Exact Date)  Physical Exam  Constitutional: She is oriented to person, place, and time. She appears well-developed and well-nourished. No distress.  Genitourinary: Rectum normal and vagina normal. Pelvic exam was performed with patient supine. There is no rash, tenderness or lesion on the right labia. There is no rash, tenderness or lesion on the left labia. No erythema or bleeding in the vagina. Right adnexum does not display mass and does not display tenderness. Left adnexum does not display mass and does not display tenderness.  Genitourinary Comments: Cervix and uterus absent. Vaginal cuff healing well.  Cardiovascular: Normal rate.  Pulmonary/Chest: Effort normal.  Abdominal: Soft. She exhibits no distension. There is no tenderness.  Incision healing well.  Musculoskeletal: Normal range of motion.  Neurological: She is alert and oriented to person, place, and time. No cranial nerve deficit.  Skin: Skin is warm and dry.  Psychiatric: She has a normal mood and affect.   Assessment: s/p :  total laparoscopic hysterectomy with bilateral salpingectomy, then repair vaginal cuff; stable  Plan: Patient has done well after surgery with no apparent complications.  I have discussed the post-operative course to date, and the expected progress moving forward.  The patient understands what complications to be concerned about.  I will see the patient in routine follow up, or sooner if needed.    Activity plan: No restriction. Clonidine for vasomotor sx's, but only takes when she  has them Vaniqa for hirsutism needs prior auth as it is too expensive.  Will cont Spironolactone but this has been ineffective, and will try for prior auth.  [name] Paul Oral Hallgren 06/06/2017, 9:47 AM

## 2017-06-18 ENCOUNTER — Other Ambulatory Visit: Payer: Self-pay | Admitting: Obstetrics & Gynecology

## 2017-08-27 DIAGNOSIS — Z79899 Other long term (current) drug therapy: Secondary | ICD-10-CM | POA: Diagnosis not present

## 2017-08-27 DIAGNOSIS — F3112 Bipolar disorder, current episode manic without psychotic features, moderate: Secondary | ICD-10-CM | POA: Diagnosis not present

## 2017-08-27 DIAGNOSIS — F900 Attention-deficit hyperactivity disorder, predominantly inattentive type: Secondary | ICD-10-CM | POA: Diagnosis not present

## 2017-11-22 ENCOUNTER — Other Ambulatory Visit: Payer: Self-pay | Admitting: Obstetrics & Gynecology

## 2017-12-04 DIAGNOSIS — F900 Attention-deficit hyperactivity disorder, predominantly inattentive type: Secondary | ICD-10-CM | POA: Diagnosis not present

## 2017-12-04 DIAGNOSIS — Z79899 Other long term (current) drug therapy: Secondary | ICD-10-CM | POA: Diagnosis not present

## 2017-12-04 DIAGNOSIS — F3170 Bipolar disorder, currently in remission, most recent episode unspecified: Secondary | ICD-10-CM | POA: Diagnosis not present

## 2017-12-10 DIAGNOSIS — Z0000 Encounter for general adult medical examination without abnormal findings: Secondary | ICD-10-CM | POA: Diagnosis not present

## 2017-12-26 DIAGNOSIS — Z131 Encounter for screening for diabetes mellitus: Secondary | ICD-10-CM | POA: Diagnosis not present

## 2017-12-26 DIAGNOSIS — Z0000 Encounter for general adult medical examination without abnormal findings: Secondary | ICD-10-CM | POA: Diagnosis not present

## 2018-01-04 ENCOUNTER — Encounter: Payer: Self-pay | Admitting: Emergency Medicine

## 2018-01-04 ENCOUNTER — Other Ambulatory Visit: Payer: Self-pay

## 2018-01-04 ENCOUNTER — Ambulatory Visit
Admission: EM | Admit: 2018-01-04 | Discharge: 2018-01-04 | Disposition: A | Discharge status: Home / Self Care | Payer: BLUE CROSS/BLUE SHIELD | Attending: Family Medicine | Admitting: Family Medicine

## 2018-01-04 DIAGNOSIS — Z79899 Other long term (current) drug therapy: Secondary | ICD-10-CM | POA: Insufficient documentation

## 2018-01-04 DIAGNOSIS — I10 Essential (primary) hypertension: Secondary | ICD-10-CM | POA: Insufficient documentation

## 2018-01-04 DIAGNOSIS — F319 Bipolar disorder, unspecified: Secondary | ICD-10-CM | POA: Insufficient documentation

## 2018-01-04 DIAGNOSIS — R002 Palpitations: Secondary | ICD-10-CM

## 2018-01-04 MED ORDER — LISINOPRIL 10 MG PO TABS: 10.0000 mg | ORAL_TABLET | Freq: Every day | ORAL | 0 refills | Status: DC

## 2018-01-04 MED ORDER — CLONIDINE HCL 0.2 MG PO TABS
0.2000 mg | ORAL_TABLET | Freq: Once | ORAL | Status: AC
  Administered 2018-01-04: 0.2 mg via ORAL

## 2018-01-04 NOTE — ED Provider Notes (Signed)
[facility]    CSN: [id number] Arrival date & time: 01/04/18  1915     History   Chief Complaint Chief Complaint  Patient presents with  . Hypertension    HPI Stacey Yates is a 42 y.o. female.   42 yo female with a c/o high blood pressure and occasional, intermittent palpitations. Denies any chest pains or shortness breath. States she has a h/o high blood pressure in the past and used to take medication. States currently only takes lamictal and adderall.   The history is provided by the patient.    Past Medical History:  Diagnosis Date  . Bipolar disorder (HCC)   . HPV in female   . Hypertension    WITH PREGNANCY-TOOK BP MEDS X 1 YEAR AFTER PREGNANCY AND BP BECAME CONTROLLED AND HAS NOT BEEN ON BP MEDS RECENTLY    Patient Active Problem List   Diagnosis Date Noted  . Dehiscence of vaginal cuff 04/04/2017  . Cervical dysplasia 03/08/2017  . Cervical intraepithelial neoplasia grade 1 06/20/2016    Past Surgical History:  Procedure Laterality Date  . ABDOMINAL HYSTERECTOMY    . BREAST SURGERY Right   . DILATION AND CURETTAGE OF UTERUS    . LEEP N/A 06/20/2016   Procedure: LOOP ELECTROSURGICAL EXCISION PROCEDURE (LEEP);  Surgeon: Nadara Mustard, MD;  Location: [facility];  Service: Gynecology;  Laterality: N/A;  . REPAIR VAGINAL CUFF N/A 04/06/2017   Procedure: REPAIR VAGINAL CUFF;  Surgeon: Nadara Mustard, MD;  Location: [facility];  Service: Gynecology;  Laterality: N/A;  . TOTAL LAPAROSCOPIC HYSTERECTOMY WITH SALPINGECTOMY Bilateral 03/08/2017   Procedure: HYSTERECTOMY TOTAL LAPAROSCOPIC WITH SALPINGECTOMY;  Surgeon: Nadara Mustard, MD;  Location: [facility];  Service: Gynecology;  Laterality: Bilateral;  . TUBAL LIGATION      OB History    Gravida  6   Para  4   Term  4   Preterm      AB  2   Living  4     SAB  2   TAB      Ectopic      Multiple      Live Births               Home Medications    Prior to Admission  medications   Medication Sig Start Date End Date Taking? Authorizing Provider  amphetamine-dextroamphetamine (ADDERALL) 10 MG tablet Take 10 mg by mouth daily with breakfast. As needed   Yes [provider]  lamoTRIgine (LAMICTAL) 200 MG tablet Take 400 mg by mouth at bedtime. hs    Yes [provider]  cloNIDine (CATAPRES - DOSED IN MG/24 HR) 0.2 mg/24hr patch Place 1 patch (0.2 mg total) once a week onto the skin. 04/17/17   Nadara Mustard, MD  Eflornithine HCl [facility]) 13.9 % cream Apply once to twice daily to affected areas 04/17/17   Nadara Mustard, MD  lisinopril (PRINIVIL,ZESTRIL) 10 MG tablet Take 1 tablet (10 mg total) by mouth daily. 01/04/18   Payton Mccallum, MD  lisinopril-hydrochlorothiazide (PRINZIDE,ZESTORETIC) 20-12.5 MG tablet Take 1 tablet by mouth daily. TAKING THIS FOR HOT FLASHES    [provider]  spironolactone (ALDACTONE) 100 MG tablet Take 1 tablet (100 mg total) by mouth 2 (two) times daily. 06/06/17   Nadara Mustard, MD  valACYclovir (VALTREX) 500 MG tablet TAKE 1 TABLET BY MOUTH TWICE DAILY 06/18/17   Nadara Mustard, MD  valACYclovir (VALTREX) 500 MG tablet TAKE 1  TABLET BY MOUTH EVERY DAY 11/22/17   Nadara Mustard, MD    Family History Family History  Problem Relation Age of Onset  . Heart attack Mother   . Heart attack Father   . Cancer Father     Social History Social History   Tobacco Use  . Smoking status: Never Smoker  . Smokeless tobacco: Never Used  Substance Use Topics  . Alcohol use: No    Comment: H/O ABUSE 8.5 YEARS AGO  . Drug use: No     Allergies   Patient has no known allergies.   Review of Systems Review of Systems   Physical Exam Triage Vital Signs ED Triage Vitals  Enc Vitals Group     BP 01/04/18 1930 (!) 171/122     Pulse Rate 01/04/18 1930 (!) 104     Resp 01/04/18 1930 14     Temp 01/04/18 1930 97.6 F (36.4 C)     Temp Source 01/04/18 1930 Oral     SpO2 01/04/18 1930 100 %      Weight 01/04/18 1926 140 lb (63.5 kg)     Height 01/04/18 1926 5\' 3"  (1.6 m)     Head Circumference --      Peak Flow --      Pain Score 01/04/18 1926 0     Pain Loc --      Pain Edu? --      Excl. in GC? --    No data found.  Updated Vital Signs BP (!) 150/115 (BP Location: Right Arm)   Pulse (!) 104   Temp 97.6 F (36.4 C) (Oral)   Resp 14   Ht 5\' 3"  (1.6 m)   Wt 140 lb (63.5 kg)   LMP 02/19/2017 (Exact Date)   SpO2 100%   BMI 24.80 kg/m   Visual Acuity Right Eye Distance:   Left Eye Distance:   Bilateral Distance:    Right Eye Near:   Left Eye Near:    Bilateral Near:     Physical Exam  Constitutional: She appears well-developed and well-nourished. No distress.  Cardiovascular: Normal rate, regular rhythm and normal heart sounds.  Pulmonary/Chest: Effort normal and breath sounds normal. No stridor. No respiratory distress. She has no wheezes. She has no rales.  Musculoskeletal: She exhibits no edema.  Skin: She is not diaphoretic.  Nursing note and vitals reviewed.    UC Treatments / Results  Labs (all labs ordered are listed, but only abnormal results are displayed) Labs Reviewed - No data to display  EKG None  Radiology No results found.  Procedures ED EKG Date/Time: 01/04/2018 8:31 PM Performed by: Payton Mccallum, MD Authorized by: Payton Mccallum, MD   ECG reviewed by ED Physician in the absence of a cardiologist: yes   Previous ECG:    Previous ECG:  Unavailable Interpretation:    Interpretation: abnormal   Rate:    ECG rate:  106   ECG rate assessment: tachycardic   Rhythm:    Rhythm: sinus tachycardia   Ectopy:    Ectopy: PVCs     PVCs:  Infrequent QRS:    QRS axis:  Normal Conduction:    Conduction: normal   ST segments:    ST segments:  Normal T waves:    T waves: normal     (including critical care time)  Medications Ordered in UC Medications  cloNIDine (CATAPRES) tablet 0.2 mg (0.2 mg Oral Given 01/04/18 1942)     Initial Impression / Assessment  and Plan / UC Course  I have reviewed the triage vital signs and the nursing notes.  Pertinent labs & imaging results that were available during my care of the patient were reviewed by me and considered in my medical decision making (see chart for details).      Final Clinical Impressions(s) / UC Diagnoses   Final diagnoses:  Hypertension, unspecified type     Discharge Instructions     Follow up with primary care provider for blood pressure management    ED Prescriptions    Medication Sig Dispense Auth. Provider   lisinopril (PRINIVIL,ZESTRIL) 10 MG tablet Take 1 tablet (10 mg total) by mouth daily. 15 tablet Payton Mccallumonty, Arva Slaugh, MD     1.diagnosis reviewed with patient 2. rx as per orders above; reviewed possible side effects, interactions, risks and benefits  3. Recommend stop Adderall for now until follow up with PCP 4. Follow-up prn if symptoms worsen or don't improve   Controlled Substance Prescriptions [place] Controlled Substance Registry consulted? Not Applicable   Payton Mccallumonty, Rutilio Yellowhair, MD 01/04/18 2038

## 2018-01-04 NOTE — ED Triage Notes (Signed)
Patient states that she checked her blood pressure at home today and it was 155/123.  Patient states that at her last PCP visit her blood pressure was elevated.  Patient denies chest pain or SOB at this time.

## 2018-01-04 NOTE — Discharge Instructions (Addendum)
Follow up with primary care provider for blood pressure management

## 2018-01-10 DIAGNOSIS — R4189 Other symptoms and signs involving cognitive functions and awareness: Secondary | ICD-10-CM | POA: Diagnosis not present

## 2018-01-10 DIAGNOSIS — I10 Essential (primary) hypertension: Secondary | ICD-10-CM | POA: Diagnosis not present

## 2018-01-10 DIAGNOSIS — R51 Headache: Secondary | ICD-10-CM | POA: Diagnosis not present

## 2018-01-18 ENCOUNTER — Other Ambulatory Visit: Payer: Self-pay | Admitting: Obstetrics & Gynecology

## 2018-01-24 DIAGNOSIS — R4189 Other symptoms and signs involving cognitive functions and awareness: Secondary | ICD-10-CM | POA: Diagnosis not present

## 2018-01-24 DIAGNOSIS — I10 Essential (primary) hypertension: Secondary | ICD-10-CM | POA: Diagnosis not present

## 2018-02-26 ENCOUNTER — Ambulatory Visit (INDEPENDENT_AMBULATORY_CARE_PROVIDER_SITE_OTHER): Payer: BLUE CROSS/BLUE SHIELD | Admitting: Obstetrics & Gynecology

## 2018-02-26 ENCOUNTER — Encounter: Payer: Self-pay | Admitting: Obstetrics & Gynecology

## 2018-02-26 VITALS — BP 120/80 | Ht 63.0 in | Wt 140.0 lb

## 2018-02-26 DIAGNOSIS — R1031 Right lower quadrant pain: Secondary | ICD-10-CM

## 2018-02-26 DIAGNOSIS — R109 Unspecified abdominal pain: Secondary | ICD-10-CM | POA: Diagnosis not present

## 2018-02-26 DIAGNOSIS — M7918 Myalgia, other site: Secondary | ICD-10-CM | POA: Diagnosis not present

## 2018-02-26 NOTE — Progress Notes (Signed)
Gynecology Pelvic Pain Evaluation   Chief Complaint  Patient presents with  . Abdominal Pain  . Dyspareunia  . Hot Flashes   History of Present Illness:   Patient is a 42 y.o. [id number] who LMP was Patient's last menstrual period was 02/19/2017 (exact date)., presents today for a problem visit.  She complains of pain.   Her pain is localized to the right sided back with radiation to the right buttocks for the last 12 mos (since hysterectomy), and only recently noted radiation to the RLQ (frontal) area, described as constant, dull and aching,  its severity is described as moderate. She has these associated symptoms which include none. Patient has these modifiers which include exercise but not complete resolution of pain ever that make it better and unable to associate with any factor that make it worse.  Context includes: spontaneous.  Past history includes TLH 03/08/17.  Also has some midline vaginal dyspareunia described as burning but does not feel he has a dryness problem.  Occas hot flash since surgery.  PMHx: She  has a past medical history of Bipolar disorder (HCC), HPV in female, and Hypertension. Also,  has a past surgical history that includes Dilation and curettage of uterus; Tubal ligation; LEEP (N/A, 06/20/2016); Total laparoscopic hysterectomy with salpingectomy (Bilateral, 03/08/2017); Breast surgery (Right); Repair vaginal cuff (N/A, 04/06/2017); and Abdominal hysterectomy., family history includes Cancer in her father; Heart attack in her father and mother.,  reports that she has never smoked. She has never used smokeless tobacco. She reports that she does not drink alcohol or use drugs.  She has a current medication list which includes the following prescription(s): amphetamine-dextroamphetamine, clonidine, eflornithine hcl, lamotrigine, lisinopril, lisinopril-hydrochlorothiazide, valacyclovir, valacyclovir, and spironolactone. Also, has No Known Allergies.  Review of Systems    Constitutional: Negative for chills, fever and malaise/fatigue.  HENT: Negative for congestion, sinus pain and sore throat.   Eyes: Negative for blurred vision and pain.  Respiratory: Negative for cough and wheezing.   Cardiovascular: Negative for chest pain and leg swelling.  Gastrointestinal: Negative for abdominal pain, constipation, diarrhea, heartburn, nausea and vomiting.  Genitourinary: Negative for dysuria, frequency, hematuria and urgency.  Musculoskeletal: Negative for back pain, joint pain, myalgias and neck pain.  Skin: Negative for itching and rash.  Neurological: Negative for dizziness, tremors and weakness.  Endo/Heme/Allergies: Does not bruise/bleed easily.  Psychiatric/Behavioral: Negative for depression. The patient is not nervous/anxious and does not have insomnia.     Objective: BP 120/80   Ht 5\' 3"  (1.6 m)   Wt 140 lb (63.5 kg)   LMP 02/19/2017 (Exact Date)   BMI 24.80 kg/m  Physical Exam  Constitutional: She is oriented to person, place, and time. She appears well-developed and well-nourished. No distress.  Genitourinary: Vagina normal. Pelvic exam was performed with patient supine. There is no rash, tenderness or lesion on the right labia. There is no rash, tenderness or lesion on the left labia. No erythema or bleeding in the vagina.  Right adnexum displays tenderness. Right adnexum does not display mass. Left adnexum does not display mass and does not display tenderness.  Genitourinary Comments: Cuff intact/ no lesions  Absent uterus and cervix  HENT:  Head: Normocephalic and atraumatic.  Nose: Nose normal.  Mouth/Throat: Oropharynx is clear and moist.  Abdominal: Soft. She exhibits no distension. There is no tenderness. There is no rebound, no guarding, no tenderness at McBurney's point and negative Murphy's sign.  Musculoskeletal: Normal range of motion.  Neurological: She is alert  and oriented to person, place, and time. No cranial nerve deficit.   Skin: Skin is warm and dry.  Psychiatric: She has a normal mood and affect.   Female chaperone present for pelvic portion of the physical exam  Assessment: 42 y.o. [id number] with RLQ pain (recent) and right back muscolskeletal or neuropathic pain (chronic).  1. RLQ abdominal pain - US PELVIS TRANSVANGINAL NON-OB (TV ONLY); Future  2. Pain in abdominal muscle of right flank  Problem List Items Addressed This Visit      Other   RLQ abdominal pain - Primary   Relevant Orders   US PELVIS TRANSVANGINAL NON-OB (TV ONLY)    Other Visit Diagnoses    Pain in abdominal muscle of right flank        Evaluate for ovarian pathology Consider musculoskeletal or neuropathic pain etiology    PT may help    Consider neurology referral      (Pain in Right Lower Back has been one year, since surgery a year ago, some radiation to buttocks, and only recent radiation to RLQ pain 1 week)  Will also schedule annual for breast exam, MMG, and labs  Annamarie Major, MD, [facility], [facility] 02/26/2018  11:05 AM

## 2018-02-27 ENCOUNTER — Ambulatory Visit: Payer: Self-pay | Admitting: Obstetrics & Gynecology

## 2018-03-06 ENCOUNTER — Ambulatory Visit (INDEPENDENT_AMBULATORY_CARE_PROVIDER_SITE_OTHER): Payer: BLUE CROSS/BLUE SHIELD | Admitting: Obstetrics & Gynecology

## 2018-03-06 ENCOUNTER — Encounter: Payer: Self-pay | Admitting: Obstetrics & Gynecology

## 2018-03-06 ENCOUNTER — Ambulatory Visit (INDEPENDENT_AMBULATORY_CARE_PROVIDER_SITE_OTHER): Payer: BLUE CROSS/BLUE SHIELD

## 2018-03-06 VITALS — BP 110/70 | Ht 63.0 in | Wt 139.0 lb

## 2018-03-06 DIAGNOSIS — R1031 Right lower quadrant pain: Secondary | ICD-10-CM | POA: Diagnosis not present

## 2018-03-06 DIAGNOSIS — M545 Low back pain, unspecified: Secondary | ICD-10-CM

## 2018-03-06 DIAGNOSIS — Z1231 Encounter for screening mammogram for malignant neoplasm of breast: Secondary | ICD-10-CM

## 2018-03-06 DIAGNOSIS — Z0000 Encounter for general adult medical examination without abnormal findings: Secondary | ICD-10-CM

## 2018-03-06 DIAGNOSIS — Z01411 Encounter for gynecological examination (general) (routine) with abnormal findings: Secondary | ICD-10-CM

## 2018-03-06 DIAGNOSIS — Z1239 Encounter for other screening for malignant neoplasm of breast: Secondary | ICD-10-CM

## 2018-03-06 DIAGNOSIS — G8929 Other chronic pain: Secondary | ICD-10-CM | POA: Insufficient documentation

## 2018-03-06 NOTE — Patient Instructions (Signed)
PAP every 5 years Mammogram every year    Call [phone] to schedule at [facility] yearly (with PCP)   Kegel Exercises Kegel exercises help strengthen the muscles that support the rectum, vagina, small intestine, bladder, and uterus. Doing Kegel exercises can help:  Improve bladder and bowel control.  Improve sexual response.  Reduce problems and discomfort during pregnancy.  Kegel exercises involve squeezing your pelvic floor muscles, which are the same muscles you squeeze when you try to stop the flow of urine. The exercises can be done while sitting, standing, or lying down, but it is best to vary your position. Phase 1 exercises 1. Squeeze your pelvic floor muscles tight. You should feel a tight lift in your rectal area. If you are a female, you should also feel a tightness in your vaginal area. Keep your stomach, buttocks, and legs relaxed. 2. Hold the muscles tight for up to 10 seconds. 3. Relax your muscles. Repeat this exercise 50 times a day or as many times as told by your health care provider. Continue to do this exercise for at least 4-6 weeks or for as long as told by your health care provider. This information is not intended to replace advice given to you by your health care provider. Make sure you discuss any questions you have with your health care provider. Document Released: 05/15/2012 Document Revised: 01/22/2016 Document Reviewed: 04/18/2015 Elsevier Interactive Patient Education  [employer].

## 2018-03-06 NOTE — Progress Notes (Signed)
HPI:      Stacey Yates is a 42 y.o. [id number] who LMP was Patient's last menstrual period was 02/19/2017 (exact date)., hysterectomy last year, she presents today for her annual examination. The patient has no complaints today. The patient is sexually active. Her last pap: was normal and last mammogram: approximate date 2018 and was normal. The patient does perform self breast exams.  There is no notable family history of breast or ovarian cancer in her family.  The patient has regular exercise: yes.  The patient denies current symptoms of depression.    Pt had [place] today to assess for GYN etiology of her LBP and recent RLQ pain.  No ovarian pathology visualized.  Normal ultrasound exam.  GYN History: Contraception: status post hysterectomy  PMHx: Past Medical History:  Diagnosis Date  . Bipolar disorder (HCC)   . HPV in female   . Hypertension    WITH PREGNANCY-TOOK BP MEDS X 1 YEAR AFTER PREGNANCY AND BP BECAME CONTROLLED AND HAS NOT BEEN ON BP MEDS RECENTLY   Past Surgical History:  Procedure Laterality Date  . ABDOMINAL HYSTERECTOMY    . BREAST SURGERY Right   . DILATION AND CURETTAGE OF UTERUS    . LEEP N/A 06/20/2016   Procedure: LOOP ELECTROSURGICAL EXCISION PROCEDURE (LEEP);  Surgeon: Nadara Mustard, MD;  Location: [facility] ORS;  Service: Gynecology;  Laterality: N/A;  . REPAIR VAGINAL CUFF N/A 04/06/2017   Procedure: REPAIR VAGINAL CUFF;  Surgeon: Nadara Mustard, MD;  Location: [facility] OR[facility];  Service: Gynecology;  Laterality: N/A;  . TOTAL LAPAROSCOPIC HYSTERECTOMY WITH SALPINGECTOMY Bilateral 03/08/2017   Procedure: HYSTERECTOMY TOTAL LAPAROSCOPIC WITH SALPINGECTOMY;  Surgeon: Nadara Mustard, MD;  Location: [facility] ORS;  Service: Gynecology;  Laterality: Bilateral;  . TUBAL LIGATION     Family History  Problem Relation Age of Onset  . Heart attack Mother   . Heart attack Father   . Cancer Father    Social History   Tobacco Use  . Smoking status: Never Smoker  .  Smokeless tobacco: Never Used  Substance Use Topics  . Alcohol use: No    Comment: H/O ABUSE 8.5 YEARS AGO  . Drug use: No    Current Outpatient Medications:  .  amphetamine-dextroamphetamine (ADDERALL) 10 MG tablet, Take 10 mg by mouth daily with breakfast. As needed, Disp: , Rfl:  .  cloNIDine (CATAPRES - DOSED IN MG/24 HR) 0.2 mg/24hr patch, Place 1 patch (0.2 mg total) once a week onto the skin., Disp: 4 patch, Rfl: 12 .  Eflornithine HCl (VANIQA) 13.9 % cream, Apply once to twice daily to affected areas, Disp: 45 g, Rfl: 3 .  lamoTRIgine (LAMICTAL) 200 MG tablet, Take 400 mg by mouth at bedtime. hs , Disp: , Rfl:  .  lisinopril (PRINIVIL,ZESTRIL) 10 MG tablet, Take 1 tablet (10 mg total) by mouth daily., Disp: 15 tablet, Rfl: 0 .  lisinopril-hydrochlorothiazide (PRINZIDE,ZESTORETIC) 20-12.5 MG tablet, Take 1 tablet by mouth daily. TAKING THIS FOR HOT FLASHES, Disp: , Rfl:  .  spironolactone (ALDACTONE) 100 MG tablet, Take 1 tablet (100 mg total) by mouth 2 (two) times daily., Disp: 60 tablet, Rfl: 11 .  valACYclovir (VALTREX) 500 MG tablet, TAKE 1 TABLET BY MOUTH TWICE DAILY, Disp: 30 tablet, Rfl: 0 .  valACYclovir (VALTREX) 500 MG tablet, TAKE 1 TABLET BY MOUTH EVERY DAY, Disp: 30 tablet, Rfl: 0 Allergies: Patient has no known allergies.  Review of Systems  Constitutional: Negative for chills, fever and malaise/fatigue.  HENT: Negative for congestion, sinus pain and sore throat.   Eyes: Negative for blurred vision and pain.  Respiratory: Negative for cough and wheezing.   Cardiovascular: Negative for chest pain and leg swelling.  Gastrointestinal: Negative for abdominal pain, constipation, diarrhea, heartburn, nausea and vomiting.  Genitourinary: Negative for dysuria, frequency, hematuria and urgency.  Musculoskeletal: Negative for back pain, joint pain, myalgias and neck pain.  Skin: Negative for itching and rash.  Neurological: Negative for dizziness, tremors and weakness.    Endo/Heme/Allergies: Does not bruise/bleed easily.  Psychiatric/Behavioral: Negative for depression. The patient is not nervous/anxious and does not have insomnia.     Objective: BP 110/70   Ht 5\' 3"  (1.6 m)   Wt 139 lb (63 kg)   LMP 02/19/2017 (Exact Date)   BMI 24.62 kg/m   Filed Weights   03/06/18 1009  Weight: 139 lb (63 kg)   Body mass index is 24.62 kg/m. Physical Exam  Constitutional: She is oriented to person, place, and time. She appears well-developed and well-nourished. No distress.  HENT:  Head: Normocephalic and atraumatic. Head is without laceration.  Right Ear: Hearing normal.  Left Ear: Hearing normal.  Nose: No epistaxis.  No foreign bodies.  Mouth/Throat: Uvula is midline, oropharynx is clear and moist and mucous membranes are normal.  Eyes: Pupils are equal, round, and reactive to light.  Neck: Normal range of motion. Neck supple. No thyromegaly present.  Cardiovascular: Normal rate and regular rhythm. Exam reveals no gallop and no friction rub.  No murmur heard. Pulmonary/Chest: Effort normal and breath sounds normal. No respiratory distress. She has no wheezes. Right breast exhibits no mass, no skin change and no tenderness. Left breast exhibits no mass, no skin change and no tenderness.  Abdominal: Soft. Bowel sounds are normal. She exhibits no distension. There is no tenderness. There is no rebound.  Musculoskeletal: Normal range of motion.  Neurological: She is alert and oriented to person, place, and time. No cranial nerve deficit.  Skin: Skin is warm and dry.  Psychiatric: She has a normal mood and affect. Judgment normal.  Vitals reviewed.  Assessment: 1. Annual physical exam   2. Screening for breast cancer   3. Chronic low back pain without sciatica, unspecified back pain laterality    Screening Plan:            1.  Cervical Screening-  Pap smear schedule reviewed with patient  2. Breast screening- Exam annually and mammogram>40 planned    3. Colonoscopy every 10 years, Hemoccult testing - after age 46  4. Labs managed by PCP  5. Counseling for contraception: no method No method needed.  6. Chronic low back pain without sciatica - Ambulatory referral to Neurology - Also consider referral for pelvic floor rehabilitation PT              7. Review of ULTRASOUND.    I have personally reviewed images and report of recent ultrasound done at [facility].    Plan of management to be discussed with patient.    No s/sx ovarian or other GYN etiology for her pain  Annamarie Major, MD, [facility], [facility] 03/06/2018  10:42 AM       F/U  Return in about 1 year (around 03/07/2019) for Annual.  Annamarie Major, MD, [facility], [facility] 03/06/2018  10:39 AM

## 2018-04-04 DIAGNOSIS — Z79899 Other long term (current) drug therapy: Secondary | ICD-10-CM | POA: Diagnosis not present

## 2018-04-04 DIAGNOSIS — F3170 Bipolar disorder, currently in remission, most recent episode unspecified: Secondary | ICD-10-CM | POA: Diagnosis not present

## 2018-04-04 DIAGNOSIS — F900 Attention-deficit hyperactivity disorder, predominantly inattentive type: Secondary | ICD-10-CM | POA: Diagnosis not present

## 2018-05-21 ENCOUNTER — Other Ambulatory Visit: Payer: Self-pay | Admitting: Obstetrics & Gynecology

## 2018-06-13 ENCOUNTER — Telehealth: Payer: Self-pay

## 2018-06-13 NOTE — Telephone Encounter (Signed)
Left message to remind pt to schedule her mammo 

## 2018-06-13 NOTE — Telephone Encounter (Signed)
-----   Message from Nadara Mustard, MD sent at 06/10/2018  7:49 AM EST ----- Regarding: MMG Received notice she has not received MMG yet as ordered at her Annual. Please check and encourage her to do this, and document conversation.

## 2018-07-03 DIAGNOSIS — B009 Herpesviral infection, unspecified: Secondary | ICD-10-CM | POA: Diagnosis not present

## 2018-07-03 DIAGNOSIS — I10 Essential (primary) hypertension: Secondary | ICD-10-CM | POA: Diagnosis not present

## 2018-07-03 DIAGNOSIS — F3170 Bipolar disorder, currently in remission, most recent episode unspecified: Secondary | ICD-10-CM | POA: Diagnosis not present

## 2018-07-03 DIAGNOSIS — F900 Attention-deficit hyperactivity disorder, predominantly inattentive type: Secondary | ICD-10-CM | POA: Diagnosis not present

## 2018-08-22 ENCOUNTER — Encounter: Payer: Self-pay | Admitting: Emergency Medicine

## 2018-08-22 ENCOUNTER — Other Ambulatory Visit: Payer: Self-pay

## 2018-08-22 ENCOUNTER — Ambulatory Visit
Admission: EM | Admit: 2018-08-22 | Discharge: 2018-08-22 | Disposition: A | Discharge status: Home / Self Care | Payer: BLUE CROSS/BLUE SHIELD | Attending: Internal Medicine | Admitting: Internal Medicine

## 2018-08-22 DIAGNOSIS — J111 Influenza due to unidentified influenza virus with other respiratory manifestations: Secondary | ICD-10-CM

## 2018-08-22 DIAGNOSIS — R0981 Nasal congestion: Secondary | ICD-10-CM

## 2018-08-22 DIAGNOSIS — R69 Illness, unspecified: Principal | ICD-10-CM

## 2018-08-22 DIAGNOSIS — R05 Cough: Secondary | ICD-10-CM

## 2018-08-22 DIAGNOSIS — R509 Fever, unspecified: Secondary | ICD-10-CM

## 2018-08-22 HISTORY — DX: Herpesviral infection of urogenital system, unspecified: A60.00

## 2018-08-22 LAB — RAPID INFLUENZA A&B ANTIGENS: Influenza B (ARMC): NEGATIVE

## 2018-08-22 LAB — RAPID STREP SCREEN (MED CTR MEBANE ONLY): Streptococcus, Group A Screen (Direct): NEGATIVE

## 2018-08-22 LAB — RAPID INFLUENZA A&B ANTIGENS (ARMC ONLY): INFLUENZA A (ARMC): NEGATIVE

## 2018-08-22 MED ORDER — OSELTAMIVIR PHOSPHATE 75 MG PO CAPS: 75.0000 mg | ORAL_CAPSULE | Freq: Two times a day (BID) | ORAL | 0 refills | Status: DC

## 2018-08-22 NOTE — ED Provider Notes (Signed)
[facility] ____________________________________________  Time seen: Approximately 11:25 AM  I have reviewed the triage vital signs and the nursing notes.   HISTORY  Chief Complaint Cough and Sore Throat   HPI Stacey Yates is a 43 y.o. female presenting for evaluation of 2 days of runny nose, nasal congestion, cough, sore throat. Some body aches, denies known fevers. States two of her kids were positive for influenza this past week.  Sore throat currently mild.  Overall continues to eat and drink well.  Did take some TheraFlu yesterday which helps some.  No over-the-counter medication taken today prior to arrival.  Denies chest pain or shortness of breath or abdominal pain.  Reports otherwise doing well.  No recent sickness.  Marisue Ivan, MD: PCP   Past Medical History:  Diagnosis Date  . Bipolar disorder (HCC)   . Genital herpes   . HPV in female   . Hypertension    WITH PREGNANCY-TOOK BP MEDS X 1 YEAR AFTER PREGNANCY AND BP BECAME CONTROLLED AND HAS NOT BEEN ON BP MEDS RECENTLY    Patient Active Problem List   Diagnosis Date Noted  . Chronic low back pain without sciatica 03/06/2018  . RLQ abdominal pain 02/26/2018  . Dehiscence of vaginal cuff 04/04/2017  . Cervical dysplasia 03/08/2017  . Cervical intraepithelial neoplasia grade 1 06/20/2016    Past Surgical History:  Procedure Laterality Date  . ABDOMINAL HYSTERECTOMY    . BREAST SURGERY Right   . DILATION AND CURETTAGE OF UTERUS    . LEEP N/A 06/20/2016   Procedure: LOOP ELECTROSURGICAL EXCISION PROCEDURE (LEEP);  Surgeon: Nadara Mustard, MD;  Location: [facility] ORS;  Service: Gynecology;  Laterality: N/A;  . REPAIR VAGINAL CUFF N/A 04/06/2017   Procedure: REPAIR VAGINAL CUFF;  Surgeon: Nadara Mustard, MD;  Location: [facility];  Service: Gynecology;  Laterality: N/A;  . TOTAL LAPAROSCOPIC HYSTERECTOMY WITH SALPINGECTOMY Bilateral 03/08/2017   Procedure: HYSTERECTOMY TOTAL LAPAROSCOPIC WITH  SALPINGECTOMY;  Surgeon: Nadara Mustard, MD;  Location: [facility] ORS;  Service: Gynecology;  Laterality: Bilateral;  . TUBAL LIGATION       No current facility-administered medications for this encounter.   Current Outpatient Medications:  .  amphetamine-dextroamphetamine (ADDERALL) 10 MG tablet, Take 10 mg by mouth daily with breakfast. As needed, Disp: , Rfl:  .  lamoTRIgine (LAMICTAL) 200 MG tablet, Take 400 mg by mouth at bedtime. hs , Disp: , Rfl:  .  valACYclovir (VALTREX) 500 MG tablet, TAKE 1 TABLET BY MOUTH EVERY DAY, Disp: 30 tablet, Rfl: 0 .  oseltamivir (TAMIFLU) 75 MG capsule, Take 1 capsule (75 mg total) by mouth every 12 (twelve) hours., Disp: 10 capsule, Rfl: 0  Allergies Patient has no known allergies.  Family History  Problem Relation Age of Onset  . Heart attack Mother   . Heart attack Father   . Cancer Father     Social History Social History   Tobacco Use  . Smoking status: Never Smoker  . Smokeless tobacco: Never Used  Substance Use Topics  . Alcohol use: No    Comment: H/O ABUSE 10 YEARS AGO  . Drug use: No    Review of Systems Constitutional: Possible fevers. ENT: As above.  Cardiovascular: Denies chest pain. Respiratory: Denies shortness of breath. Gastrointestinal: No abdominal pain.  Musculoskeletal: Negative for back pain. Skin: Negative for rash.   ____________________________________________   PHYSICAL EXAM:  VITAL SIGNS: ED Triage Vitals  Enc Vitals Group     BP 08/22/18 1015 109/89  Pulse Rate 08/22/18 1015 79     Resp 08/22/18 1015 16     Temp 08/22/18 1015 98.2 F (36.8 C)     Temp Source 08/22/18 1015 Oral     SpO2 08/22/18 1015 100 %     Weight 08/22/18 1016 140 lb (63.5 kg)     Height 08/22/18 1016 5\' 3"  (1.6 m)     Head Circumference --      Peak Flow --      Pain Score 08/22/18 1016 0     Pain Loc --      Pain Edu? --      Excl. in GC? --     Constitutional: Alert and oriented. Well appearing and in no acute  distress. Head: Atraumatic. No sinus tenderness to palpation. No swelling. No erythema.  Ears: no erythema, normal TMs bilaterally.   Nose:Nasal congestion  Mouth/Throat: Mucous membranes are moist. Mild pharyngeal erythema. No tonsillar swelling or exudate.  Neck: No stridor.  No cervical spine tenderness to palpation. Hematological/Lymphatic/Immunilogical: No cervical lymphadenopathy. Cardiovascular: Normal rate, regular rhythm. Grossly normal heart sounds.  Good peripheral circulation. Respiratory: Normal respiratory effort.  No retractions. No wheezes, rales or rhonchi. Good air movement.  Musculoskeletal: Ambulatory with steady gait. No cervical, thoracic or lumbar tenderness to palpation. Neurologic:  Normal speech and language. No gait instability. Skin:  Skin appears warm, dry and intact. No rash noted. Psychiatric: Mood and affect are normal. Speech and behavior are normal. ___________________________________________   LABS (all labs ordered are listed, but only abnormal results are displayed)  Labs Reviewed  RAPID INFLUENZA A&B ANTIGENS ([facility] ONLY)  RAPID STREP SCREEN ([facility] ONLY)  RAPID STREP SCREEN ([facility] ONLY)  RAPID INFLUENZA A&B ANTIGENS ([facility] ONLY)  CULTURE, GROUP A STREP [facility])   ____________________________________________   PROCEDURES Procedures   INITIAL IMPRESSION / ASSESSMENT AND PLAN / ED COURSE  Pertinent labs & imaging results that were available during my care of the patient were reviewed by me and considered in my medical decision making (see chart for details).  Well-appearing patient.  No acute distress.  Strep negative, flu negative.  However positive exposure at home, concern for influenza-like illness.  Discussed treatment options, Rx Tamiflu given.  Encourage rest, fluids, supportive care. Discussed indication, risks and benefits of medications with patient.  Discussed follow up with Primary care physician this week. Discussed  follow up and return parameters including no resolution or any worsening concerns. Patient verbalized understanding and agreed to plan.   ____________________________________________   FINAL CLINICAL IMPRESSION(S) / ED DIAGNOSES  Final diagnoses:  Influenza-like illness     ED Discharge Orders         Ordered    oseltamivir (TAMIFLU) 75 MG capsule  Every 12 hours     08/22/18 1058           Note: This dictation was prepared with Dragon dictation along with smaller phrase technology. Any transcriptional errors that result from this process are unintentional.         Renford Dills, NP 08/22/18 1138

## 2018-08-22 NOTE — Discharge Instructions (Addendum)
Take medication as prescribed. Rest. Drink plenty of fluids. Continue over the  counter medication.  ° °Follow up with your primary care physician this week as needed. Return to Urgent care for new or worsening concerns.  ° °

## 2018-08-22 NOTE — ED Triage Notes (Signed)
Patient in today c/o 2 day history of cough and sore throat. Patient denies fever. Patient states she has had some body aches today, but just worked out. Patient has tried OTC Theraflu. Patient states that 2 of her children have had the flu in the past month.

## 2018-08-25 LAB — CULTURE, GROUP A STREP (THRC)

## 2018-10-02 DIAGNOSIS — F3170 Bipolar disorder, currently in remission, most recent episode unspecified: Secondary | ICD-10-CM | POA: Diagnosis not present

## 2018-10-02 DIAGNOSIS — F900 Attention-deficit hyperactivity disorder, predominantly inattentive type: Secondary | ICD-10-CM | POA: Diagnosis not present

## 2018-12-25 DIAGNOSIS — B009 Herpesviral infection, unspecified: Secondary | ICD-10-CM | POA: Diagnosis not present

## 2018-12-25 DIAGNOSIS — F3170 Bipolar disorder, currently in remission, most recent episode unspecified: Secondary | ICD-10-CM | POA: Diagnosis not present

## 2018-12-25 DIAGNOSIS — I10 Essential (primary) hypertension: Secondary | ICD-10-CM | POA: Diagnosis not present

## 2018-12-25 DIAGNOSIS — F900 Attention-deficit hyperactivity disorder, predominantly inattentive type: Secondary | ICD-10-CM | POA: Diagnosis not present

## 2019-03-19 DIAGNOSIS — F900 Attention-deficit hyperactivity disorder, predominantly inattentive type: Secondary | ICD-10-CM | POA: Diagnosis not present

## 2019-03-19 DIAGNOSIS — F3170 Bipolar disorder, currently in remission, most recent episode unspecified: Secondary | ICD-10-CM | POA: Diagnosis not present

## 2019-03-19 DIAGNOSIS — I10 Essential (primary) hypertension: Secondary | ICD-10-CM | POA: Diagnosis not present

## 2019-03-19 DIAGNOSIS — B009 Herpesviral infection, unspecified: Secondary | ICD-10-CM | POA: Diagnosis not present

## 2019-05-01 DIAGNOSIS — Z20828 Contact with and (suspected) exposure to other viral communicable diseases: Secondary | ICD-10-CM | POA: Diagnosis not present

## 2019-08-21 ENCOUNTER — Other Ambulatory Visit: Payer: Self-pay

## 2019-08-21 ENCOUNTER — Ambulatory Visit
Admission: EM | Admit: 2019-08-21 | Discharge: 2019-08-21 | Disposition: A | Discharge status: Home / Self Care | Payer: BLUE CROSS/BLUE SHIELD | Attending: Family Medicine | Admitting: Family Medicine

## 2019-08-21 ENCOUNTER — Encounter: Payer: Self-pay | Admitting: Emergency Medicine

## 2019-08-21 DIAGNOSIS — H6692 Otitis media, unspecified, left ear: Secondary | ICD-10-CM | POA: Diagnosis present

## 2019-08-21 DIAGNOSIS — R0981 Nasal congestion: Secondary | ICD-10-CM

## 2019-08-21 DIAGNOSIS — J029 Acute pharyngitis, unspecified: Secondary | ICD-10-CM

## 2019-08-21 DIAGNOSIS — Z20822 Contact with and (suspected) exposure to covid-19: Secondary | ICD-10-CM | POA: Insufficient documentation

## 2019-08-21 DIAGNOSIS — R5383 Other fatigue: Secondary | ICD-10-CM | POA: Diagnosis not present

## 2019-08-21 DIAGNOSIS — J069 Acute upper respiratory infection, unspecified: Secondary | ICD-10-CM

## 2019-08-21 LAB — SARS CORONAVIRUS 2 (TAT 6-24 HRS): SARS Coronavirus 2: NEGATIVE

## 2019-08-21 MED ORDER — AMOXICILLIN-POT CLAVULANATE 875-125 MG PO TABS: 1.0000 | ORAL_TABLET | Freq: Two times a day (BID) | ORAL | 0 refills | Status: AC

## 2019-08-21 NOTE — ED Triage Notes (Signed)
Pt c/o headache, sore throat, runny nose, nasal congestion. Started about 2-3 days ago. denies fever, or body aches.

## 2019-08-21 NOTE — ED Provider Notes (Signed)
[place], [place]   Name: Stacey Yates DOB: 05-31-1976 MRN: [id number] CSN: [id number] PCP: Dion Body, MD  Arrival date and time:  08/21/19 0814  Chief Complaint:  Nasal Congestion and Sore Throat   NOTE: Prior to seeing the patient today, I have reviewed the triage nursing documentation and vital signs. Clinical staff has updated patient's PMH/PSHx, current medication list, and drug allergies/intolerances to ensure comprehensive history available to assist in medical decision making.   History:   HPI: Stacey Yates is a 44 y.o. female who presents today with complaints of fatigue, congestion, rhinorrhea, and BILATERAL ear pain that started approximately 3 days days ago. Patient denies fevers. She denies any cough, shortness of breath, or wheezing. Patient denies PMH (+) for seasonal allergies. She has been blowing her nose a lot; notes green/yellow sputum. Patient denies that she has experienced any nausea, vomiting, diarrhea, or abdominal pain. She is eating and drinking well. Patient denies any perceived alterations to her sense of taste or smell. Patient denies being in close contact with anyone known to be ill; no one else is her home has experienced a similar symptom constellation. She has not been tested for SARS-CoV-2 (novel coronavirus) in the past 14 days; last tested negative in 04/2019 per her report.  Patient has been vaccinated for influenza this season. In efforts to conservatively manage her symptoms at home, the patient notes that she has used allergy medicatiosn, which have not helped to improve her symptoms.  Past Medical History:  Diagnosis Date  . Bipolar disorder ([place])   . Genital herpes   . HPV in female   . Hypertension    WITH PREGNANCY-TOOK BP MEDS X 1 YEAR AFTER PREGNANCY AND BP BECAME CONTROLLED AND HAS NOT BEEN ON BP MEDS RECENTLY    Past Surgical History:  Procedure Laterality Date  . ABDOMINAL HYSTERECTOMY    . BREAST SURGERY Right   . DILATION  AND CURETTAGE OF UTERUS    . LEEP N/A 06/20/2016   Procedure: LOOP ELECTROSURGICAL EXCISION PROCEDURE (LEEP);  Surgeon: Gae Dry, MD;  Location: [facility] ORS;  Service: Gynecology;  Laterality: N/A;  . REPAIR VAGINAL CUFF N/A 04/06/2017   Procedure: REPAIR VAGINAL CUFF;  Surgeon: Gae Dry, MD;  Location: [facility];  Service: Gynecology;  Laterality: N/A;  . TOTAL LAPAROSCOPIC HYSTERECTOMY WITH SALPINGECTOMY Bilateral 03/08/2017   Procedure: HYSTERECTOMY TOTAL LAPAROSCOPIC WITH SALPINGECTOMY;  Surgeon: Gae Dry, MD;  Location: [facility] ORS;  Service: Gynecology;  Laterality: Bilateral;  . TUBAL LIGATION      Family History  Problem Relation Age of Onset  . Heart attack Mother   . Heart attack Father   . Cancer Father     Social History   Tobacco Use  . Smoking status: Never Smoker  . Smokeless tobacco: Never Used  Substance Use Topics  . Alcohol use: No    Comment: H/O ABUSE 10 YEARS AGO  . Drug use: No    Patient Active Problem List   Diagnosis Date Noted  . Chronic low back pain without sciatica 03/06/2018  . RLQ abdominal pain 02/26/2018  . Dehiscence of vaginal cuff 04/04/2017  . Cervical dysplasia 03/08/2017  . Cervical intraepithelial neoplasia grade 1 06/20/2016    Home Medications:    Current Meds  Medication Sig  . amphetamine-dextroamphetamine (ADDERALL) 10 MG tablet Take 10 mg by mouth daily with breakfast. As needed  . lamoTRIgine (LAMICTAL) 200 MG tablet Take 400 mg by mouth at bedtime. hs   .  valACYclovir (VALTREX) 500 MG tablet TAKE 1 TABLET BY MOUTH EVERY DAY    Allergies:   Patient has no known allergies.  Review of Systems (ROS):  Review of systems NEGATIVE unless otherwise noted in narrative H&P section.   Vital Signs: Today's Vitals   08/21/19 0839 08/21/19 0842  BP:  121/88  Pulse:  85  Resp:  18  Temp:  98.5 F (36.9 C)  TempSrc:  Oral  SpO2:  100%  Weight: 135 lb (61.2 kg)   Height: 5\' 3"  (1.6 m)   PainSc: 3       Physical Exam: Physical Exam  Constitutional: She is oriented to person, place, and time and well-developed, well-nourished, and in no distress.  HENT:  Head: Normocephalic and atraumatic.  Right Ear: There is tenderness. No drainage or swelling. A middle ear effusion (mild serous) is present.  Left Ear: There is tenderness. No drainage or swelling. Tympanic membrane is erythematous and bulging. A middle ear effusion (suppurative) is present.  Nose: Rhinorrhea and sinus tenderness present.  Mouth/Throat: Uvula is midline and mucous membranes are normal. Posterior oropharyngeal erythema present. No oropharyngeal exudate or posterior oropharyngeal edema.  Eyes: Pupils are equal, round, and reactive to light.  Cardiovascular: Normal rate, regular rhythm, normal heart sounds and intact distal pulses.  Pulmonary/Chest: Effort normal and breath sounds normal.  No cough noted in clinic. No SOB or increased WOB. No distress. Able to speak in complete sentences without difficulties. SPO2 100% on RA.  Lymphadenopathy:       Head (left side): Submandibular adenopathy present.  Neurological: She is alert and oriented to person, place, and time. Gait normal.  Skin: Skin is warm and dry. No rash noted. She is not diaphoretic.  Psychiatric: Mood, memory, affect and judgment normal.  Nursing note and vitals reviewed.   Urgent Care Treatments / Results:   Orders Placed This Encounter  Procedures  . SARS CORONAVIRUS 2 (TAT 6-24 HRS) Nasopharyngeal Nasopharyngeal Swab    LABS: PLEASE NOTE: all labs that were ordered this encounter are listed, however only abnormal results are displayed. Labs Reviewed  SARS CORONAVIRUS 2 (TAT 6-24 HRS)    EKG: -None  RADIOLOGY: No results found.  PROCEDURES: Procedures  MEDICATIONS RECEIVED THIS VISIT: Medications - No data to display  PERTINENT CLINICAL COURSE NOTES/UPDATES:   Initial Impression / Assessment and Plan / Urgent Care Course:   Pertinent labs & imaging results that were available during my care of the patient were personally reviewed by me and considered in my medical decision making (see lab/imaging section of note for values and interpretations).  Stacey Yates is a 44 y.o. female who presents to [facility] today with complaints of Nasal Congestion and Sore Throat  Patient overall well appearing and in no acute distress today in clinic. Presenting symptoms (see HPI) and exam as documented above. She presents with symptoms associated with SARS-CoV-2 (novel coronavirus). Discussed typical symptom constellation. Reviewed potential for infection and need for testing. Patient amenable to being tested. SARS-CoV-2 swab collected by certified clinical staff. Discussed variable turn around times associated with testing, as swabs are being processed at the main campus of [facility] in [place], and have been taking 12-24 hours to come back. She was advised to self quarantine, per [facility] DHHS guidelines, until negative results received. These measures are being implemented out of an abundance of caution to prevent transmission and spread during the current SARS-CoV-2 pandemic.  Presenting symptoms consistent with UTI, which is likely viral.  However, with that being said, the LEFT ear with suppurative effusion and erythematous TM consistent with AOME. Discussed that, until ruled out with confirmatory lab testing, SARS-CoV-2 remains part of the differential. Her testing is pending at this time. Patient is going out of town once [employer] results received. Given the appearance of her ear, coupled with her current symptoms, I anticipate her symptoms to worsen. Will proceed with treatment using a 7 day course of amoxicillin-clavulanate. Discussed supportive care measures at home during acute phase of illness. Patient to rest as much as possible. She was encouraged to ensure adequate hydration (water and ORS) to prevent dehydration and  electrolyte derangements. Patient may use APAP and/or IBU on an as needed basis for pain/fever. Patient encouraged to continue decongestants and add  In guaifenesin and fluticasone as needed for symptoms.   Discussed follow up with primary care physician in 1 week for re-evaluation. I have reviewed the follow up and strict return precautions for any new or worsening symptoms. Patient is aware of symptoms that would be deemed urgent/emergent, and would thus require further evaluation either here or in the emergency department. At the time of discharge, she verbalized understanding and consent with the discharge plan as it was reviewed with her. All questions were fielded by provider and/or clinic staff prior to patient discharge.    Final Clinical Impressions / Urgent Care Diagnoses:   Final diagnoses:  Upper respiratory tract infection, unspecified type  Left otitis media, unspecified otitis media type  Encounter for laboratory testing for COVID-19 virus    New Prescriptions:  [place] Controlled Substance Registry consulted? Not Applicable  Meds ordered this encounter  Medications  . amoxicillin-clavulanate (AUGMENTIN) 875-125 MG tablet    Sig: Take 1 tablet by mouth 2 (two) times daily for 7 days.    Dispense:  14 tablet    Refill:  0    Recommended Follow up Care:  Patient encouraged to follow up with the following provider within the specified time frame, or sooner as dictated by the severity of her symptoms. As always, she was instructed that for any urgent/emergent care needs, she should seek care either here or in the emergency department for more immediate evaluation.  Follow-up Information    Marisue Ivan, MD In 1 week.   Specialty: Family Medicine Why: General reassessment of symptoms if not improving Contact information: [address] [facility] [place] [address] [phone]         NOTE: This note was prepared using Dragon dictation software  along with smaller phrase technology. Despite my best ability to proofread, there is the potential that transcriptional errors may still occur from this process, and are completely unintentional.     Verlee Monte, NP 08/21/19 [phone]

## 2019-08-21 NOTE — Discharge Instructions (Addendum)
It was very nice seeing you today in clinic. Thank you for entrusting me with your care.   Rest and make sure you are staying hydrated. Use medication as prescribed. May add mucinex and/or flonase. May use Tylenol and/or Ibuprofen as needed for pain/fever.   You were tested for SARS-CoV-2 (novel coronavirus) today. Testing is being processed at the main campus of [facility] in [place], and have been taking 12-24 hours to come back. Current recommendations from the the CDC and [place] DHHS require that you at home until negative test results are have been received. In the event that your test results are positive, you will be contacted with further directives. These measures are being implemented out of an abundance of caution to prevent transmission and spread during the current SARS-CoV-2 pandemic.  Make arrangements to follow up with your regular doctor in 1 week for re-evaluation if not improving. If your symptoms/condition worsens, please seek follow up care either here or in the ER. Please remember, our [facility] providers are "right here with you" when you need [place].   Again, it was my pleasure to take care of you today. Thank you for choosing our clinic. I hope that you start to feel better quickly.   Quentin Mulling, MSN, APRN, FNP-C, CEN Advanced Practice Provider [facility]

## 2021-03-23 ENCOUNTER — Encounter: Payer: BC Managed Care – PPO | Admitting: Dermatology

## 2021-06-29 ENCOUNTER — Other Ambulatory Visit: Payer: Self-pay | Admitting: Family Medicine

## 2021-06-29 DIAGNOSIS — Z1231 Encounter for screening mammogram for malignant neoplasm of breast: Secondary | ICD-10-CM

## 2023-05-31 ENCOUNTER — Ambulatory Visit: Payer: BC Managed Care – PPO

## 2023-05-31 DIAGNOSIS — Z1211 Encounter for screening for malignant neoplasm of colon: Secondary | ICD-10-CM

## 2023-05-31 DIAGNOSIS — K640 First degree hemorrhoids: Secondary | ICD-10-CM

## 2024-04-08 ENCOUNTER — Ambulatory Visit (INDEPENDENT_AMBULATORY_CARE_PROVIDER_SITE_OTHER)

## 2024-04-08 ENCOUNTER — Encounter: Payer: Self-pay | Admitting: Podiatry

## 2024-04-08 ENCOUNTER — Ambulatory Visit: Admitting: Podiatry

## 2024-04-08 VITALS — Ht 63.0 in | Wt 135.0 lb

## 2024-04-08 DIAGNOSIS — M21611 Bunion of right foot: Secondary | ICD-10-CM | POA: Diagnosis not present

## 2024-04-08 DIAGNOSIS — M7751 Other enthesopathy of right foot: Secondary | ICD-10-CM

## 2024-04-08 DIAGNOSIS — M7752 Other enthesopathy of left foot: Secondary | ICD-10-CM

## 2024-04-08 DIAGNOSIS — M2041 Other hammer toe(s) (acquired), right foot: Secondary | ICD-10-CM | POA: Diagnosis not present

## 2024-04-08 DIAGNOSIS — M2042 Other hammer toe(s) (acquired), left foot: Secondary | ICD-10-CM | POA: Diagnosis not present

## 2024-04-08 DIAGNOSIS — M21612 Bunion of left foot: Secondary | ICD-10-CM

## 2024-04-08 NOTE — Progress Notes (Signed)
 Chief Complaint  Patient presents with   Foot Pain    Pt is here due to right foot pain, has been going on for a while has been dealing with it, she says the muscles in the foot feels tight, states she stretches the foot, soaks it and takes OTC medicine for the pain. Also concern with corns to the side of both feet.    HPI: 48 y.o. female presenting today as a new patient for evaluation of chronic pain and tenderness to the bilateral feet.  Past Medical History:  Diagnosis Date   Bipolar disorder (HCC)    Genital herpes    HPV in female    Hypertension    WITH PREGNANCY-TOOK BP MEDS X 1 YEAR AFTER PREGNANCY AND BP BECAME CONTROLLED AND HAS NOT BEEN ON BP MEDS RECENTLY    Past Surgical History:  Procedure Laterality Date   ABDOMINAL HYSTERECTOMY     BREAST SURGERY Right    DILATION AND CURETTAGE OF UTERUS     LEEP N/A 06/20/2016   Procedure: LOOP ELECTROSURGICAL EXCISION PROCEDURE (LEEP);  Surgeon: Lamar SHAUNNA Lesches, MD;  Location: [facility] ORS;  Service: Gynecology;  Laterality: N/A;   REPAIR VAGINAL CUFF N/A 04/06/2017   Procedure: REPAIR VAGINAL CUFF;  Surgeon: Lesches Lamar SHAUNNA, MD;  Location: [facility] ORS;  Service: Gynecology;  Laterality: N/A;   TOTAL LAPAROSCOPIC HYSTERECTOMY WITH SALPINGECTOMY Bilateral 03/08/2017   Procedure: HYSTERECTOMY TOTAL LAPAROSCOPIC WITH SALPINGECTOMY;  Surgeon: Lesches Lamar SHAUNNA, MD;  Location: [facility] ORS;  Service: Gynecology;  Laterality: Bilateral;   TUBAL LIGATION      No Known Allergies   Physical Exam: General: The patient is alert and oriented x3 in no acute distress.  Dermatology: Skin is warm, dry and supple bilateral lower extremities.  Lister corn noted to the fifth digits bilateral adjacent to the nail plate with associated tenderness  Vascular: Palpable pedal pulses bilaterally. Capillary refill within normal limits.  No appreciable edema.  No erythema.  Neurological: Grossly intact via light touch  Musculoskeletal Exam: With loadbearing  there is very prominent head of the first metatarsal noted bilateral with no overlying redness and irritation from shoes.  Adductovarus hammertoe contracture deformity also noted to the fifth digits bilateral  Radiographic Exam B/L feet 04/08/2024:  Normal osseous mineralization. Joint spaces preserved.  No fractures or osseous irregularities noted.  Adductovarus hammertoe deformity noted to the lesser digit bilateral  Assessment/Plan of Care: 1.  Bunion bilateral great toes 2.  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  X-rays reviewed -Although the patient does not have necessarily a pathological increase in the intermetatarsal angle the head of the first metatarsals are very prominent and proud and seem to rub in all of her shoes.  She has significant pain and tenderness associated to close toed shoes.  I do believe that surgery is warranted at this time -Today we discussed surgery in detail including the risk benefits advantages and disadvantages as well as the postoperative recovery course.  No guarantees were expressed or implied.  All patient questions were answered.  Postoperatively she will be in surgical shoes bilateral and stressed the importance of reducing her activity over the following 4 to 6 weeks postoperatively.  She assures me that she will be able to do this. -Authorization for surgery was initiated today.  Surgery will consist of bunionectomy with osteotomy bilateral.  Flexor tenotomy with excision of benign skin lesion (lister corn) bilateral fifth toes -Return to  clinic 1 week postop  *going to the [facility] in November. Husband's [employer]       Thresa EMERSON Sar, [place] [facility]  Dr. Thresa EMERSON Sar, DPM    [address] [place], [place] [address]                Office [phone]  Fax [phone]

## 2024-04-17 ENCOUNTER — Encounter: Payer: Self-pay | Admitting: Podiatry

## 2024-04-21 ENCOUNTER — Telehealth: Payer: Self-pay | Admitting: Podiatry

## 2024-04-21 NOTE — Telephone Encounter (Signed)
 Called and scheduled patient for surgery on 07/03/2024. Patient is not on any GLP1 medications or blood thinners. Patient pharmacy is correct in chart. Patient aware [facility] will call to let them know arrival time for date of surgery.

## 2024-05-18 NOTE — H&P (View-Only) (Signed)
 HISTORY OF PRESENT ILLNESS  Stacey Yates is a 48 y.o. F with past medical history for herpes simplex, bipolar affect, ADD, HTN (well-managed, stemming from pregnancy per patient) presenting to clinic for surgical second opinion of CC: Right 5th and 1st toe pain.  Background: Patient relays progressive several month history of right foot pain.  Her foot pain primarily stems from the fifth digit as it is aggravating in all of her shoes. She does relate that since the fifth digit started to becoming an issue, her first MPJ began to have an issue as well primarily overlying the 'Bump' and instigating symptoms of ' tightness.' The most comfortable shoes that she can wear are her dress boots that are wider in the forefoot and have less of a heel -however it still does not resolve the issue.  She does not relate any acute nature of this condition more so that this has been a progressive gradual onset and no new issues triggered this symptom.  She has irritation of these areas primarily wearing her baseline shoes -and relates that she is a ' shoe girl' and that is something that she would like to continue use without aggressive shoe modifications -- as her shoes bring her happiness.  Pain characterized: Sore, achy, tight Nature: Aggravated with pressure Pain is located: Distal fifth digit at area of hyperkeratotic lesion  ('corn'), bump pain  Onset of condition / symptoms: Gradual  Course: Progressive Aggravating factors: Tighter/closed shoes, increased activity Alleviating factors: Shoes with flexible overlying fabric, wider shoes, barefoot  Prior treatment modalities have included:  + wider / more supportive shoe gear -yes, she notices that boots are most comfortable, however when she wears tighter shoes for events/charge she is having a lot of pain and aggravation to the fifth digit primarily but also feels extremely ' tight' overlying the first MPJ on the right - orthotics -no, patient cannot put these  in most of her shoes. - stretching -patient conducts intermittent stretching on her own.  No formal program. + icing ; mild temporary relief overlying first MPJ + topical creams, nothing consistent, mostly for moisture/callus care distal fifth digit. + NSAIDs -intermittent, relates there is temporary relief to first MPJ, however she does not notice any change to her fifth digit - physical therapy - injections - prior surgery -no, however patient was seen by another provider Dr. Janit who had recommended 1/5 digit derotational arthroplasty, flexor tenotomy, and possibly first MPJ cheilectomy (?) from patient recollection and documentation reviewed.  She reports that she does have a surgical date that is planned at the end of January for the bilateral foot to be done, but wanted a second opinion to see if something can help alleviate her right foot pain faster, and if this was a surgical intervention that was recommended overall.  Currently ambulates in: Boots  Of note: Patient was seen by another provider  Given the symptoms caused by the bunion complaint have caused difficulty with successfully completing ADLs pain free / impacting patient QOL --- despite prior measures to assist with the issue --- patient was prompted to present to office in order to be evaluated/ seek assistance with resolution of symptoms. Denies other acute pedal complaints at this time.  Of note: - Patient IS NOT diabetic.  - Patient HAS NO history of smoking. - Patient WORKS. (Work: Physiological Scientist)   Patient Active Problem List  Diagnosis   Herpes simplex on valtrex for prophylaxis - previously followed by Dr. Arloa  Bipolar affect, depressed (CMS-HCC) - [facility]; Dr. Aleck Ash  [facility])   ADD (attention deficit disorder) - followed by Dr. Aleck Ash; [facility])   Essential hypertension    Current Outpatient Medications  Medication  Instructions   dextroamphetamine -amphetamine  (ADDERALL) 10 mg tablet 10 mg, 2 times Daily PRN   lamoTRIgine  (LAMICTAL ) 150 mg, Daily   lisinopriL  (ZESTRIL ) 10 mg, Oral, Daily   risperiDONE (RISPERDAL M-TABS) 0.5 MG disintegrating tablet DISSOLVE 1 TO 2 TABLETS ON THE TONGUE DAILY AS NEEDED FOR ANXIETY OR AGITATION   tobramycin (TOBREX) 0.3 % ophthalmic solution INSTILL 1 DROP IN LEFT EYE FOUR TIMES DAILY FOR 1 WEEK   traZODone (DESYREL) 50 mg, Nightly PRN   tretinoin (RETIN-A) 0.025 % cream APPLY A PEA SIZED AMOUNT TO DRY FACE EVERY OTHER NIGHT AS TOLERATED. SLOWLY BUILD UP TO NIGHTLY USE   valACYclovir (VALTREX) 500 MG tablet TAKE 1 TABLET(500 MG) BY MOUTH DAILY   VYVANSE 40 mg, Every morning       Objective   Physical Exam  BP 115/77   Ht 158.8 cm (5' 2.52)   Wt 56.9 kg (125 lb 7.1 oz)   LMP 08/25/2016 (Exact Date) Comment: Has irregular cycles per pt.   BMI 22.56 kg/m   Constitutional: Patient appears of normal development and good nutritional status for stated age, well-groomed, and of normal body habitus. Phonating appropriately.  Lymphatic: On examination of both lower extremities, there is no lymphadenopathy, lymphangitis, or lymphedema on either the right or the left feet or legs.  Psychiatric: Alert and oriented to time, place, person and situation. The patient is noted to have good judgment and insight into the reason for todays visit and treatments.  FOCUSED LOWER EXTREMITY EXAMINATION:  Neurological: Gross protective sensation intact bilaterally. No focal motor or sensory deficits identified bilaterally. No Tinels or Valliexs sign bilaterally. + Joplins neuritis noted to bilateral foot.  Vascular: Dorsalis Pedis artery on palpation: 2 Posterior Tibial artery on palpation: 1 Capillary Filling Times: WNL Peripheral edema: none  Dependency Rubor: none Varicosities: Mild  Dermatological: Skin quality: WNL bilaterally. No ulcerations, open wounds  noted bilaterally. Interdigital spaces: C/D/I bilaterally. Hyperkeratotic FOCAL tissue noted: Distal aspect of the fifth digit most consistent with listers corn as well as diffuse to the PIPJ lateral aspect -bilaterally R>L  Musculoskeletal: Overall foot type: Fairly rectus with wider forefoot bilaterally, adductovarus component noted to the fifth digit bilaterally WB -arch noted Toes abutting/ over-ridding: Yes, fifth digit is underwriting the fourth bilaterally Global foot - TTP: Noted to the fifth digit bilaterally with adductovarus component, also noted to the medial bump prominence at the first metatarsal head bilaterally R>L Tendons: Achilles, Posterior Tibialis, Anterior Tibialis, Peroneals, EDL/ FDL, EHL/ FDL. Palpated and felt to be intact and without pain along tendon course. Muscle strength: 5/5 in all 4 quadrants Ankle Joint ROM: Decreased, equinus noted bilaterally STJ ROM: WNL  1st TMT ROM: WNL  1st MPJ: ROM: WNL, however mildly decreased with end range of motion no crepitus felt. Medial bump TTP -present -clinically there is more of a prominence noted on the left side, right side as well however less notable than left. Trackbound deformity present   XR IMAGING: 3 views of the LEFT foot obtained. DATE: 05/19/2024  Calcaneal Inclination Angle (N = 20-30): Cyma Line (N = Congruous ; A= pes planus): Mild anterior break Intermetatarsal Angle: Grossly WNL Hallux ABDuctus Angle: Grossly WNL Hallux ABDuctus INTERphalangeus Angle: WNL 1st MPJ joint space: WNL no frank cystic  changes noted mild decrease in joint space overall however this is symmetric without any significant focal destruction of the joint noted 1st MPJ alignment: Congruous Tibial sesamoid position is fairly within normal limits however is mildly suggestive of rotation component in comparison to the right side. There is noted dorsomedial prominence of the left metatarsal head primarily noted on oblique  view. Osteophytic lipping noted at the posterior and plantar aspect of the calcaneus. Mild rotational contracture noted at the distal aspect of the fifth digit as well as the fourth digit suggestive of adductovarus rotation deformity of the 5th and 4th digit. No significant bowing of the fifth metatarsal.  Impression: 3 views of the foot with osseous structure alignments and prominence suggestive of adductovarus deformity of the fifth digit as well as osseous prominence of the first metatarsal head.  The impression of the radiographs were discussed with patient with additional reference to clinical symptoms and potential treatment options.     XR IMAGING: 3 views of the RIGHT foot obtained. DATE: 12/ 01/2024  Calcaneal Inclination Angle (N = 20-30): WNL  Cyma Line (N = Congruous ; A= pes planus): Congruous more in rectus alignment than the left side Intermetatarsal Angle (N = 8-12): WNL Hallux ABDuctus Angle: WNL Hallux ABDuctus INTERphalangeus Angle (N= 8- 10): NL 1st MPJ joint space: There is mild joint space narrowing noted on the medial aspect of the first metatarsophalangeal joint however there are no gross frank cystic changes noted or osseous spurring to the medial and lateral condyles noted on AP view.  There is noted osseous proliferation as well as spurring to the medial aspect of the joint primarily noted on oblique view as well as osseous prominence.  Cystic changes noted in this view. 1st MPJ alignment: Congruous Osseous spurring noted to the plantar and posterior aspect of the calcaneus More significant contracture noted to the fifth digit with rotational component underlying the fourth digit soft tissue envelope.  Consistent with adductovarus rotation of the fifth digit.  Impression: 3 views of the foot with the osseous structure alignment some prominence suggestive of adductovarus deformity of the fifth digit and beginning stages of osteoarthritis versus potential clinical  hallux limitus of the right first MPJ. The impression of the radiographs were discussed with patient with additional reference to clinical symptoms and potential treatment options. Results  Assessment/Plan:   Assessment & Plan  Encounter Diagnoses  Name Primary?   Right foot pain    Left foot pain    Hallux limitus of right foot    Bunion of right foot    Adductovarus rotation of toe, acquired, right Yes    Right foot pain -     X-ray foot right 3 plus views  Left foot pain -     X-ray foot left 3 plus views    This visit today consisted of multiple elements: Number and complexity of problems addressed and the Risk of Morbidity/Mortality are both: LOW  Number and complexity of problems addressed: 1 acute uncomplicated injury  I have determined the complexity of the problem and Condition(s), etiologies, and options for care, treatment plan, and prognosis were reviewed with the patient. Upon discussion of above, treatment plan was devised and agreed upon by provider and patient. Both conservative and surgical options for care were discussed. All questions were answered in detail.  - Radiographs Obtained: 3 Views taken and reviewed of the BILATERAL FOOT: AP, Lat, Oblique Please see PE for full read. Impression discussed with patient with additional correlation to  clinical symptoms and potential treatment options.  Conservative treatment options discussed with patient, including but not limited to: ORTHOTICS/ SHOE GEAR / STRETCHING / MEDICATIONS / PHYSICAL THERAPY / INJECTION  - Discussed usual escalation of treatment for this current issue, primarily the conservative modalities we are able to employ, including: shoe gear, orthotics, contrast baths, icing, topical NSAIDs, PO NSAID, corticosteroid injection, Physical therapy, and last resort of surgical intervention.  *Patient primarily presented today to get a second surgical opinion of her bilateral foot pain.  She reports her  main concern is her right foot, that her fifth digit was the first thing that brought her in but she does have issues with her first toe as well.  She was given the option to do bilateral cheilectomy's and derotational arthroplasties of the fifth digit in January with Dr. Janit and would like to see if this is a warranted procedure and if she should proceed with that or if she should seek other treatment master methods.  Given her foot structure and radiographic imaging, I am in agreement with Dr. Janit that she has a hallux limitus and bump pain associated with the first MPJ she has fairly good range of motion of her first MPJ without any crepitus or impingement type issues, or pain on end range of motion.  There is no significant biomechanical anomaly noted on radiographic imaging such as increased IM angle or frank destruction of the first MPJ that would suggest a more aggressive procedure -though overall foot structure on the left side may be suggestive of progressive type of bunion deformity her current status would not warrant anything more aggressive than a cheilectomy -especially given her clinical subjective complaints.  As for her fifth digit this is fairly significant even without shoes, and if surgical intervention were to occur I agree that a derotational component for surgery would assist with alleviating the callus type symptoms. I discussed thoroughly what the conservative treatment options are (shoe gear modifications, orthotics, mini shoe stretchers, topical creams, conservative callus management, ETC), and patient reported that she has tried some of these treatment options however that her quality of life is impacted given she cannot wear the type of shoes that she enjoys and has had progressive symptoms -she would like to proceed with surgical intervention. Personally, even though these procedures are fairly benign in osseous nature, I mentioned that I would not do bilateral osseous procedures  during the same surgical intervention.  This bilateral type procedure is something that is within the standard of care, and she is more than welcome to have this done by Dr. Janit, as I do believe that they are procedures she could potentially benefit from.  However personally, my professional choice is to do 1 foot at a time in order to minimize any complication risk and postoperative setting and improve protective/shielding measures.  Patient relates that she has 4 kids, all independent and can assist her in the postoperative period.  I reviewed surgical option choices, and agree that a cheilectomy and derotational arthroplasty are appropriate for her right side foot complaints.  We discussed postoperative period and disability time as well as postoperative protocol and protective measures.  If patient would like to proceed with surgical interventions through our offices, I am happy to do so but offered solely the right foot to be done given this is her main complaint.  I advised that she is able to call Dr. Janit and speak with him again regarding surgical consultation and if she has further  questions for his specific postoperative period as I am unable to comment on his specific treatment plan -solely what I would do as a provider.  Patient is aware of the recommendations and discussion that we had the today/that was documented. We reviewed both foot x-rays as she feels that her left foot is starting to become an issue as well. I advised that she can review the options that we discussed today, call Dr. Doris office and process which option is best suited for her/her lifestyle/postoperative course. If patient would like to proceed with surgical intervention through our offices, we may be able to do the right foot surgery 06/10/2024, before the end of the year -however she may need preoperative appointment through our offices the week before surgery.    STRETCHING  - Discussed importance of stretching  and role played with limited ankle DF / tightness of the gastroc complex on the increased pressure to the 1st MPJ. Educated on appropriate GS / calf stretches.  - CMA to dispense handout electronically 05/22/2024  ORTHOTICS/ SHOEGEAR - Educated on importance of proper / supportive shoe gear. Demonstrated / discussed shoe stability test and relationship to pressure distribution in the foot. - Recommended initial trial with OTC orthotics. Should patient identify benefit of orthotic but desire a more aggressive support apparatus may discuss custom orthotics at that time.  - Educated that patient may gain symptomatic relief with these modalities, as well as potentially delaying progression of deformity, however this will not correct the physical deformity that already exists. *Patient is aware of this.  MEDICATION - We discussed callus care in form of urea as well as self debridement efforts -patient to defer currently as her calluses were recently shaved down by Dr. Janit. -We discussed topical NSAID such as Voltaren which she can use over-the-counter to assist with any bump pain and tightness sensation of the right first MPJ. - Patient has been educated about the Moderate risks associated with p.o. NSAIDs, as well as Low risks associated with topical NSAIDs. Patient educated about possible side effects possible adverse reactions and to stop medication and call office if any arise. A prescription was written today and will be medically managed. - Discussed benefit of local injection to reduce inflammation/ gain symptomatic relief for joint related bunion pain. This injection can be employed during any point during treatment and patient was offered with caveat of only 4 injections a year are permitted as well as associated r/b/a to injections.  Should no improvement be seen after employing these modalities in interim visit, patient advised to escalate to next level of treatment. For last resort we covered  surgical intervention as documented in the above plan.  Should patient wish to proceed, we discussed surgical date of possibly 06/10/2024 and the need for preoperative consent forms/appointment 1 week prior to.  Patient is aware.  RTC preoperative appointment

## 2024-05-29 ENCOUNTER — Encounter: Admission: RE | Admit: 2024-05-29 | Source: Ambulatory Visit

## 2024-05-29 ENCOUNTER — Other Ambulatory Visit: Payer: Self-pay

## 2024-05-29 DIAGNOSIS — M21611 Bunion of right foot: Secondary | ICD-10-CM

## 2024-05-29 DIAGNOSIS — Z01812 Encounter for preprocedural laboratory examination: Secondary | ICD-10-CM

## 2024-05-29 DIAGNOSIS — I10 Essential (primary) hypertension: Secondary | ICD-10-CM

## 2024-05-29 NOTE — Patient Instructions (Addendum)
 Your procedure is scheduled on: 06/10/2024  Report to the Registration Desk on the 1st floor of the [facility]. To find out your arrival time, please call [phone] between 1PM - 3PM on: 06/09/2024  If your arrival time is 6:00 am, do not arrive before that time as the [facility] entrance doors do not open until 6:00 am.  REMEMBER: Instructions that are not followed completely may result in serious medical risk, up to and including death; or upon the discretion of your surgeon and anesthesiologist your surgery may need to be rescheduled.  Do not eat food after midnight the night before surgery.  No gum chewing or hard candies.  One week prior to surgery: Stop Anti-inflammatories (NSAIDS) groups  such as Advil, Aleve, Ibuprofen, Motrin, Naproxen, Naprosyn and Aspirin based products such as Excedrin, Goody's Powder, BC Powder. Stop ANY OVER THE COUNTER supplements until after surgery.  You may however, continue to take Tylenol  if needed for pain up until the day of surgery.   Continue taking all of your other prescription medications up until the day of surgery.  ON THE DAY OF SURGERY ONLY TAKE THESE MEDICATIONS WITH SIPS OF WATER:  Nothing     No Alcohol for 24 hours before or after surgery.  No Smoking including e-cigarettes for 24 hours before surgery.  No chewable tobacco products for at least 6 hours before surgery.  No nicotine patches on the day of surgery.  Do not use any recreational drugs for at least a week (preferably 2 weeks) before your surgery.  Please be advised that the combination of cocaine  and anesthesia may have negative outcomes, up to and including death. If you test positive for cocaine , your surgery will be cancelled.  On the morning of surgery brush your teeth with toothpaste and water, you may rinse your mouth with mouthwash if you wish. Do not swallow any toothpaste or mouthwash.  Use CHG Soap as directed on instruction sheet to decrease the  risk of getting surgical site infection.  Do not wear jewelry, make-up, hairpins, clips or nail polish.  For welded (permanent) jewelry: bracelets, anklets, waist bands, etc.  Please have this removed prior to surgery.  If it is not removed, there is a chance that hospital personnel will need to cut it off on the day of surgery.  Do not wear lotions, powders, or perfumes.   Do not shave body hair from the neck down 48 hours before surgery.  Contact lenses, hearing aids and dentures may not be worn into surgery.  Do not bring valuables to the hospital. [facility] is not responsible for any missing/lost belongings or valuables.    Notify your doctor if there is any change in your medical condition (cold, fever, infection).  Wear comfortable clothing (specific to your surgery type) to the hospital.  After surgery, you can help prevent lung complications by doing breathing exercises.  Take deep breaths and cough every 1-2 hours. Your doctor may order a device called an Incentive Spirometer to help you take deep breaths.   If you are being discharged the day of surgery, you will not be allowed to drive home. You will need a responsible individual to drive you home and stay with you for 24 hours after surgery.     Please call the Pre-admissions Testing Dept. at [phone] if you have any questions about these instructions.  Surgery Visitation Policy:  Patients having surgery or a procedure may have two visitors.  Children under the  age of 26 must have an adult with them who is not the patient.   Merchandiser, Retail to address health-related social needs:  [email or web address]                                                                                                             Preparing for Surgery with CHLORHEXIDINE  GLUCONATE (CHG) Soap  Chlorhexidine  Gluconate (CHG) Soap  o An antiseptic cleaner that kills germs and bonds with the skin to  continue killing germs even after washing  o Used for showering the night before surgery and morning of surgery  Before surgery, you can play an important role by reducing the number of germs on your skin.  CHG (Chlorhexidine  gluconate) soap is an antiseptic cleanser which kills germs and bonds with the skin to continue killing germs even after washing.  Please do not use if you have an allergy to CHG or antibacterial soaps. If your skin becomes reddened/irritated stop using the CHG.  1. Shower the NIGHT BEFORE SURGERY with CHG soap.  2. If you choose to wash your hair, wash your hair first as usual with your normal shampoo.  3. After shampooing, rinse your hair and body thoroughly to remove the shampoo.  4. Use CHG as you would any other liquid soap. You can apply CHG directly to the skin and wash gently with a clean washcloth.  5. Apply the CHG soap to your body only from the neck down. Do not use on open wounds or open sores. Avoid contact with your eyes, ears, mouth, and genitals (private parts). Wash face and genitals (private parts) with your normal soap.  6. Wash thoroughly, paying special attention to the area where your surgery will be performed.  7. Thoroughly rinse your body with warm water.  8. Do not shower/wash with your normal soap after using and rinsing off the CHG soap.  9. Do not use lotions, oils, etc., after showering with CHG.  10. Pat yourself dry with a clean towel.  11. Wear clean pajamas to bed the night before surgery.  12. Place clean sheets on your bed the night of your shower and do not sleep with pets.  13. Do not apply any deodorants/lotions/powders.  14. Please wear clean clothes to the hospital.  15. Remember to brush your teeth with your regular toothpaste.

## 2024-05-30 ENCOUNTER — Encounter
Admission: RE | Admit: 2024-05-30 | Discharge: 2024-05-30 | Disposition: A | Discharge status: Home / Self Care | Source: Ambulatory Visit

## 2024-05-30 ENCOUNTER — Encounter: Payer: Self-pay | Admitting: Urgent Care

## 2024-05-30 DIAGNOSIS — M21611 Bunion of right foot: Secondary | ICD-10-CM | POA: Insufficient documentation

## 2024-05-30 DIAGNOSIS — I10 Essential (primary) hypertension: Secondary | ICD-10-CM | POA: Insufficient documentation

## 2024-05-30 DIAGNOSIS — Z01818 Encounter for other preprocedural examination: Secondary | ICD-10-CM | POA: Insufficient documentation

## 2024-05-30 DIAGNOSIS — Z01812 Encounter for preprocedural laboratory examination: Secondary | ICD-10-CM

## 2024-05-30 LAB — CBC
HCT: 43.1 % (ref 36.0–46.0)
Hemoglobin: 14.5 g/dL (ref 12.0–15.0)
MCH: 30.2 pg (ref 26.0–34.0)
MCHC: 33.6 g/dL (ref 30.0–36.0)
MCV: 89.8 fL (ref 80.0–100.0)
Platelets: 335 K/uL (ref 150–400)
RBC: 4.8 MIL/uL (ref 3.87–5.11)
RDW: 12.1 % (ref 11.5–15.5)
WBC: 7.4 K/uL (ref 4.0–10.5)
nRBC: 0 % (ref 0.0–0.2)

## 2024-06-10 ENCOUNTER — Ambulatory Visit: Payer: Self-pay | Admitting: Urgent Care

## 2024-06-10 ENCOUNTER — Other Ambulatory Visit: Payer: Self-pay

## 2024-06-10 ENCOUNTER — Ambulatory Visit
Admission: RE | Admit: 2024-06-10 | Discharge: 2024-06-10 | Disposition: A | Discharge status: Home / Self Care | Source: Ambulatory Visit

## 2024-06-10 ENCOUNTER — Ambulatory Visit

## 2024-06-10 ENCOUNTER — Encounter: Admission: RE | Disposition: A | Discharge status: Home / Self Care | Payer: Self-pay | Source: Ambulatory Visit

## 2024-06-10 DIAGNOSIS — M21611 Bunion of right foot: Secondary | ICD-10-CM | POA: Insufficient documentation

## 2024-06-10 DIAGNOSIS — I10 Essential (primary) hypertension: Secondary | ICD-10-CM | POA: Insufficient documentation

## 2024-06-10 DIAGNOSIS — M205X1 Other deformities of toe(s) (acquired), right foot: Secondary | ICD-10-CM | POA: Insufficient documentation

## 2024-06-10 DIAGNOSIS — M2041 Other hammer toe(s) (acquired), right foot: Secondary | ICD-10-CM | POA: Diagnosis not present

## 2024-06-10 DIAGNOSIS — Z9889 Other specified postprocedural states: Secondary | ICD-10-CM

## 2024-06-10 DIAGNOSIS — M79671 Pain in right foot: Secondary | ICD-10-CM | POA: Diagnosis present

## 2024-06-10 HISTORY — PX: CHEILECTOMY: SHX1336

## 2024-06-10 HISTORY — PX: HAMMER TOE SURGERY: SHX385

## 2024-06-10 SURGERY — CHEILECTOMY
Anesthesia: General | Site: Toe | Laterality: Right

## 2024-06-10 MED ORDER — ORAL CARE MOUTH RINSE: 15.0000 mL | Freq: Once | OROMUCOSAL | Status: AC

## 2024-06-10 MED ORDER — PROPOFOL 10 MG/ML IV BOLUS
INTRAVENOUS | Status: AC
  Filled 2024-06-10: qty 20

## 2024-06-10 MED ORDER — FENTANYL CITRATE (PF) 100 MCG/2ML IJ SOLN
INTRAMUSCULAR | Status: AC
  Filled 2024-06-10: qty 2

## 2024-06-10 MED ORDER — BUPIVACAINE LIPOSOME 1.3 % IJ SUSP
INTRAMUSCULAR | Status: DC | PRN
  Administered 2024-06-10: 8 mL

## 2024-06-10 MED ORDER — IBUPROFEN 800 MG PO TABS: 800.0000 mg | ORAL_TABLET | Freq: Three times a day (TID) | ORAL | 0 refills | Status: AC | PRN

## 2024-06-10 MED ORDER — OXYCODONE HCL 5 MG/5ML PO SOLN: 5.0000 mg | Freq: Once | ORAL | Status: AC | PRN

## 2024-06-10 MED ORDER — CHLORHEXIDINE GLUCONATE 0.12 % MT SOLN
15.0000 mL | Freq: Once | OROMUCOSAL | Status: AC
  Administered 2024-06-10: 15 mL via OROMUCOSAL

## 2024-06-10 MED ORDER — PROPOFOL 10 MG/ML IV BOLUS
INTRAVENOUS | Status: DC | PRN
  Administered 2024-06-10: 200 mg via INTRAVENOUS

## 2024-06-10 MED ORDER — BUPIVACAINE LIPOSOME 1.3 % IJ SUSP
INTRAMUSCULAR | Status: AC
  Filled 2024-06-10: qty 10

## 2024-06-10 MED ORDER — FENTANYL CITRATE (PF) 100 MCG/2ML IJ SOLN
INTRAMUSCULAR | Status: DC | PRN
  Administered 2024-06-10 (×2): 50 ug via INTRAVENOUS

## 2024-06-10 MED ORDER — CEFAZOLIN SODIUM-DEXTROSE 2-4 GM/100ML-% IV SOLN
2.0000 g | INTRAVENOUS | Status: AC
  Administered 2024-06-10: 2 g via INTRAVENOUS

## 2024-06-10 MED ORDER — MIDAZOLAM HCL 2 MG/2ML IJ SOLN
INTRAMUSCULAR | Status: AC
  Filled 2024-06-10: qty 2

## 2024-06-10 MED ORDER — 0.9 % SODIUM CHLORIDE (POUR BTL) OPTIME
TOPICAL | Status: DC | PRN
  Administered 2024-06-10: 285 mL

## 2024-06-10 MED ORDER — PHENYLEPHRINE 80 MCG/ML (10ML) SYRINGE FOR IV PUSH (FOR BLOOD PRESSURE SUPPORT)
PREFILLED_SYRINGE | INTRAVENOUS | Status: AC
  Filled 2024-06-10: qty 10

## 2024-06-10 MED ORDER — FENTANYL CITRATE (PF) 100 MCG/2ML IJ SOLN
25.0000 ug | INTRAMUSCULAR | Status: DC | PRN
  Administered 2024-06-10: 25 ug via INTRAVENOUS

## 2024-06-10 MED ORDER — MIDAZOLAM HCL (PF) 2 MG/2ML IJ SOLN
INTRAMUSCULAR | Status: DC | PRN
  Administered 2024-06-10: 2 mg via INTRAVENOUS

## 2024-06-10 MED ORDER — EPHEDRINE SULFATE-NACL 50-0.9 MG/10ML-% IV SOSY
PREFILLED_SYRINGE | INTRAVENOUS | Status: DC | PRN
  Administered 2024-06-10: 5 mg via INTRAVENOUS

## 2024-06-10 MED ORDER — BUPIVACAINE HCL (PF) 0.5 % IJ SOLN
INTRAMUSCULAR | Status: AC
  Filled 2024-06-10: qty 30

## 2024-06-10 MED ORDER — OXYCODONE HCL 5 MG PO TABS
ORAL_TABLET | ORAL | Status: AC
  Filled 2024-06-10: qty 1

## 2024-06-10 MED ORDER — PHENYLEPHRINE 80 MCG/ML (10ML) SYRINGE FOR IV PUSH (FOR BLOOD PRESSURE SUPPORT)
PREFILLED_SYRINGE | INTRAVENOUS | Status: DC | PRN
  Administered 2024-06-10 (×2): 80 ug via INTRAVENOUS

## 2024-06-10 MED ORDER — OXYCODONE HCL 5 MG PO TABS
5.0000 mg | ORAL_TABLET | Freq: Once | ORAL | Status: AC | PRN
  Administered 2024-06-10: 5 mg via ORAL

## 2024-06-10 MED ORDER — EPHEDRINE 5 MG/ML INJ
INTRAVENOUS | Status: AC
  Filled 2024-06-10: qty 5

## 2024-06-10 MED ORDER — BUPIVACAINE HCL (PF) 0.5 % IJ SOLN
INTRAMUSCULAR | Status: DC | PRN
  Administered 2024-06-10: 10 mL

## 2024-06-10 MED ORDER — LACTATED RINGERS IV SOLN: INTRAVENOUS | Status: DC

## 2024-06-10 MED ORDER — CHLORHEXIDINE GLUCONATE 0.12 % MT SOLN
OROMUCOSAL | Status: AC
  Filled 2024-06-10: qty 15

## 2024-06-10 MED ORDER — CEFAZOLIN SODIUM-DEXTROSE 2-4 GM/100ML-% IV SOLN
INTRAVENOUS | Status: AC
  Filled 2024-06-10: qty 100

## 2024-06-10 SURGICAL SUPPLY — 30 items
BENZOIN TINCTURE PRP APPL 2/3 (GAUZE/BANDAGES/DRESSINGS) ×2 IMPLANT
BLADE MED AGGRESSIVE (BLADE) IMPLANT
BLADE OSC/SAGITTAL MD 5.5X18 (BLADE) ×2 IMPLANT
BLADE SURG 15 STRL LF DISP TIS (BLADE) IMPLANT
BNDG ESMARCH 4X12 STRL LF (GAUZE/BANDAGES/DRESSINGS) ×2 IMPLANT
BNDG GAUZE DERMACEA FLUFF 4 (GAUZE/BANDAGES/DRESSINGS) ×2 IMPLANT
BNDG STRETCH 4X75 STRL LF (GAUZE/BANDAGES/DRESSINGS) ×2 IMPLANT
BOOT WALKER MEDIUM (SOFTGOODS) IMPLANT
BUR 4X45 EGG (BURR) IMPLANT
CUFF TOURN SGL QUICK 18X4 (TOURNIQUET CUFF) IMPLANT
DRAPE FLUOR MINI C-ARM 54X84 (DRAPES) ×2 IMPLANT
DRSG EMULSION OIL 3X3 NADH (GAUZE/BANDAGES/DRESSINGS) ×2 IMPLANT
DURAPREP 26ML APPLICATOR (WOUND CARE) ×2 IMPLANT
ELECTRODE REM PT RTRN 9FT ADLT (ELECTROSURGICAL) ×2 IMPLANT
GAUZE SPONGE 4X4 12PLY STRL (GAUZE/BANDAGES/DRESSINGS) ×2 IMPLANT
GLOVE BIOGEL PI IND STRL 6.5 (GLOVE) ×2 IMPLANT
GLOVE SURG SS PI 6.5 STRL IVOR (GLOVE) ×2 IMPLANT
GOWN STRL REUS W/ TWL LRG LVL3 (GOWN DISPOSABLE) ×4 IMPLANT
KIT TURNOVER KIT A (KITS) ×2 IMPLANT
KWIRE DBL END TROCAR 6X.045 (WIRE) IMPLANT
NS IRRIG 500ML POUR BTL (IV SOLUTION) ×2 IMPLANT
PACK EXTREMITY ARMC (MISCELLANEOUS) ×2 IMPLANT
PENCIL SMOKE EVACUATOR (MISCELLANEOUS) ×2 IMPLANT
SOLUTION PREP PVP 2OZ (MISCELLANEOUS) ×2 IMPLANT
STOCKINETTE IMPERV 14X48 (MISCELLANEOUS) ×2 IMPLANT
STRIP CLOSURE SKIN 1/4X4 (GAUZE/BANDAGES/DRESSINGS) ×2 IMPLANT
SUT ETHILON 5-0 FS-2 18 BLK (SUTURE) IMPLANT
SUT MNCRL+ 5-0 UNDYED PC-3 (SUTURE) ×2 IMPLANT
SUT VIC AB 3-0 SH 27X BRD (SUTURE) ×2 IMPLANT
SUT VICRYL AB 3-0 FS1 BRD 27IN (SUTURE) ×2 IMPLANT

## 2024-06-10 NOTE — Brief Op Note (Signed)
 BRIEF POSTOPERATIVE NOTE   Stacey Yates 06/10/2024   SURGEON: Nolan Lasser G. Yazlin Ekblad, DPM  ASSISTANT(S): none  PRE-OPERATIVE DIAGNOSIS:  Hallux limitus, right foot  Bunion, right foot  Adductovarus fifth digit, right foot  POST-OPERATIVE DIAGNOSIS: same  PROCEDURE PERFORMED: Bunionectomy without osteotomy, first metatarsal, right foot  Derotational arthroplasty, fifth digit, right foot  ANESTHESIA: general    ESTIMATED BLOOD LOSS: minimal  SPECIMEN(S):  none * No order type specified *   COMPLICATIONS: none  CONDITION ON TRANSFER: stable  DISPOSITION: home  Stacey Yates

## 2024-06-10 NOTE — Op Note (Signed)
 FOOT AND ANKLE SURGERY  OPERATIVE REPORT  SURGEON: Obediah Welles G. 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 The first metatarsophalangeal joint was inspected and the metatarsal head was noted to be 30% void of articular cartilage in primarily plantar medial and central direction without focal cystic changes. First metatarsophalangeal joint with improved range of motion with approximately 10-15 degrees gained in dorsiflexion post resection of limiting soft tissue and osseous structures.  PATHOLOGY/SPECIMEN(S):   * No specimens in log *   Indications for Procedure Stacey Yates is a 48 y.o. female that presented with painful, right forefoot deformities including a rigid adductovarus deformity of the 5th digit causing irritation, corn formation, and difficulty with shoe gear. In addition her first metatarsophalangeal joint had began to feel 'tight' with mild reduction in end range of motion as well as beginning osseous formation to the dorsomedial aspect of the first metatarsal - I.e. a medially oriented 'bump'. Conservative measures including shoe and activity modification, padding, and periodic debridement failed to provide lasting relief. Risks, benefits, and alternatives--including continued conservative care, various fifth digit surgical interventions (arthroplasty, arthrodesis etc.) and first digit surgical interventions (joint sparring versus joint destruction procedures), or no treatment--were discussed with the patient, who  elected to proceed with hammer toe correction in likely form of derotational arthroplasty for the fifth digit and bunionectomy without osteotomy for the first metatarsal, on the right foot. Informed consent was obtained.  Description of Procedure The patient was brought to the operating room and was transitioned to the operating room table in the supine position. A standard awake time-out was conducted to confirm the correct patient, procedure, side, and site. All team members were in agreement.  Anesthesia team initiated general anesthesia and administered 2g Ancef  for prophylactic antibiosis.  The operative extremity was prepped with alcohol, and 10 ml of plain local anesthetic was administered for local first and 5th ray block. An ankle tourniquet was then applied and set to . The extremity was then scrubbed, prepped and draped in the usual aseptic manner. After elevating and exsanguinating the operative extremity, the tourniquet was inflated to 250 mmHg.  Attention was directed to the fifth digit, where passive rotation underneath the 4th digit was observed. There were noted to be two distinct lesions - one very small lesions noted to the distal lateral immediate nail fold (small) and one lesion that was larger, deeper in appearance with evidence of chronic skin changes noted to the distal lateral plantar aspect of the digit - corresponding to the weight bearing surface and pressure point. This larger lesion was identified clinically as the patients main pressure point. A double semi-elliptical incision was made from distal dorsomedial to plantar proximal lateral in orientation encompassing the level of the proximal phalanx head. The skin wedge was excised to allow for derotation and removal of redundant tissue. Dissection was carried down carefully through the subcutaneous tissue, with protection of neurovascular structures. The extensor tendon was identified and transected transversely at the level  of the PIPJ. Capsulotomy and collateral ligament release were performed, exposing the head of the proximal phalanx. A power oscillating bone  saw was used to resect the head of the proximal phalanx in a smooth, perpendicular fashion. All rough bony edges were rasped to a smooth contour. Redundant capsule was excised as needed. The digit was then derotated into corrected alignment and noted to retain some stability on derotation. Attention was then directed to the distal digit in which both lesions were readily visible. With belief that the main irritation point (that being the larger lesion noted to the distal lateral plantar aspect of the digit) had been effectively neutralized with the derotation, it was felt that a further ellipse of skin distally via another incision (in opposite orientation) would not be warranted at this time. The wound was irrigated with copious sterile saline via bulb syringe.  The extensor tendon and capsule were re-approximated via 3-0 Vicryl to maintain the corrected position.  Layered closure was achieved with 3-0 Vicryl for deep tissues and 5-0 nylon for skin. Hemostasis was excellent. Steri-strips were then cut and applied to the fifth digit to act as a splint and assist with reinforcement of the digit derotation. The fifth digit appeared overall in an excellent rectus position, improved alignment from preoperative condition status post derotational arthroplasty.  Next, attention was directed to the first metatarsophalangeal joint where a small dorsomedial ridge was palpated just proximal to the first metatarsal head. Range of motion was noted to be approximately 50 degrees dorsiflexion and 40 degrees plantar flexion without any hard stop. A dorsomedial longitudinal incision was made over the first metatarsophalangeal joint. The incision was carried down through subcutaneous tissue with care being taken to identify and retract all vital neurovascular structures. EHL tendon was  retracted laterally. A modified T capsulotomy was performed, and the first metatarsophalangeal joint was exposed. The small osseous prominence that was noted clinically was visualized and resected using a combination of sagittal saw and rongeur. A soft-tissue band consistent with a synovial plica was identified at the first metatarsophalangeal joint - with the joint ranged on inspection - this band was noted to create a mechanical impingement and restricted dorsiflexion capacity. This structure was carefully released and resected via #15 blade, resulting in improved joint excursion without instability. The first metatarsophalangeal joint was subsequently ranged and noted to have improved range of motion with approximately 10-15 degrees gained in dorsiflexion. The first metatarsophalangeal joint was inspected and the metatarsal head noted to be 30% void of articular cartilage in primarily plantar medial and central direction without focal cystic changes. Mild remaining osteophytic lipping and prominence was reduced via sagittal saw and egg burr with care taken to contour the metatarsal head smoothly and preserve the articular cartilage. The joint was again taken through a full range of motion, demonstrating a significant improvement in dorsiflexion compared to preoperative assessment, with no evidence of impingement. The surgical site was copiously irrigated with sterile saline.  The redundant medial capsular tissue was identified and addressed with a capsulorrhaphy. The capsule was imbricated and advanced using 3-0 Vicryl, restoring appropriate soft tissue tension and joint alignment. Stability was confirmed through a full range of motion without overconstraint. Next layered closure occurred via Vicryl 3-0 in subcutaneous tissues and horizontal mattress 5-0 nylon.  The tourniquet was released and immediate hyperemia was noted in all the toes of the operative foot. A further local postoperative block with  exparel  was administered along the incisions. A dry sterile dressing was applied consisting of betadine soaked adaptic, 4 x 4 gauze (placed in position to keep both the first and fifth digit in rectus position),  Kling, kerlix and 4-inch ACE.   Overall, the patient tolerated the procedure and anesthesia well. They were transported to the recovery room with vital signs stable and vascular status intact to the operative foot. Once the patient received Xrays and was deemed appropriate by the recovery room staff, the patient was discharged to home in stable condition with the following physical and verbal post-operative instructions (below).   POST-OPERATIVE PLAN:  Patient was instructed to be WBAT on the operative foot in a CAM boot (patient preference).  Activity: Limit excessive ambulation. Elevate as much as possible until your post-operative appointment.  Pain control: A prescription was sent to the pharmacy. To be taken only as prescribed.  DVT ppx: None indicated for this procedure.  Follow up appointment: Podiatry clinic. 1 week: Site check / bandage change  2 week: Consider - Suture removal + XR ; Transition to regular shoe gear as tolerated once incision is healed

## 2024-06-10 NOTE — Transfer of Care (Signed)
 Immediate Anesthesia Transfer of Care Note  Patient: Stacey Yates  Procedure(s) Performed: CHEILECTOMY (Right) CORRECTION, HAMMER TOE (Right: Toe)  Patient Location: PACU  Anesthesia Type:General  Level of Consciousness: sedated and responds to stimulation  Airway & Oxygen Therapy: Patient Spontanous Breathing  Post-op Assessment: Report given to RN  Post vital signs: Reviewed and stable  Last Vitals:  Vitals Value Taken Time  BP 116/73 06/10/24 14:36  Temp    Pulse 88 06/10/24 14:39  Resp 15 06/10/24 14:39  SpO2 98 % 06/10/24 14:39  Vitals shown include unfiled device data.  Last Pain:  Vitals:   06/10/24 1047  TempSrc: Temporal  PainSc: 0-No pain         Complications: No notable events documented.

## 2024-06-10 NOTE — Anesthesia Procedure Notes (Signed)
 Procedure Name: LMA Insertion Date/Time: 06/10/2024 1:02 PM  Performed by: Satira Johnson, CRNAPre-anesthesia Checklist: Patient identified, Emergency Drugs available, Suction available, Patient being monitored and Timeout performed Patient Re-evaluated:Patient Re-evaluated prior to induction Oxygen Delivery Method: Circle system utilized Preoxygenation: Pre-oxygenation with 100% oxygen Induction Type: IV induction Ventilation: Mask ventilation without difficulty LMA: LMA inserted LMA Size: 3.0 Tube secured with: Tape Dental Injury: Teeth and Oropharynx as per pre-operative assessment

## 2024-06-10 NOTE — Anesthesia Preprocedure Evaluation (Signed)
 "                                  Anesthesia Evaluation  Patient identified by MRN, date of birth, ID band Patient awake    Reviewed: Allergy & Precautions, H&P , NPO status , Patient's Chart, lab work & pertinent test results, reviewed documented beta blocker date and time   Airway Mallampati: II  TM Distance: >3 FB Neck ROM: full    Dental  (+) Teeth Intact   Pulmonary neg pulmonary ROS   Pulmonary exam normal        Cardiovascular Exercise Tolerance: Good hypertension, On Medications negative cardio ROS Normal cardiovascular exam Rate:Normal     Neuro/Psych  PSYCHIATRIC DISORDERS   Bipolar Disorder   negative neurological ROS  negative psych ROS   GI/Hepatic negative GI ROS, Neg liver ROS,,,  Endo/Other  negative endocrine ROS    Renal/GU      Musculoskeletal   Abdominal   Peds  Hematology negative hematology ROS (+)   Anesthesia Other Findings Past Medical History: No date: ADHD (attention deficit hyperactivity disorder) No date: Bipolar disorder (HCC) No date: Bunion of right foot No date: Genital herpes 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 No date: Hypertension     Comment:  WITH PREGNANCY-TOOK BP MEDS X 1 YEAR AFTER PREGNANCY AND              BP BECAME CONTROLLED AND HAS NOT BEEN ON BP MEDS RECENTLY  Past Surgical History: No date: ABDOMINAL HYSTERECTOMY No date: BREAST SURGERY; Right No date: DILATION AND CURETTAGE OF UTERUS 06/20/2016: LEEP; N/A     Comment:  Procedure: LOOP ELECTROSURGICAL EXCISION PROCEDURE               (LEEP);  Surgeon: Lamar SHAUNNA Lesches, MD;  Location: [facility]               ORS;  Service: Gynecology;  Laterality: N/A; 04/06/2017: REPAIR VAGINAL CUFF; N/A     Comment:  Procedure: REPAIR VAGINAL CUFF;  Surgeon: Lesches Lamar SHAUNNA, MD;  Location: [facility] ORS;  Service: Gynecology;                Laterality: N/A; 03/08/2017: TOTAL LAPAROSCOPIC HYSTERECTOMY WITH SALPINGECTOMY;  Bilateral     Comment:  Procedure:  HYSTERECTOMY TOTAL LAPAROSCOPIC WITH               SALPINGECTOMY;  Surgeon: Lesches Lamar SHAUNNA, MD;  Location:              [facility] ORS;  Service: Gynecology;  Laterality: Bilateral; No date: TUBAL LIGATION  BMI    Body Mass Index: 22.86 kg/m      Reproductive/Obstetrics negative OB ROS                              Anesthesia Physical Anesthesia Plan  ASA: 2  Anesthesia Plan: General   Post-op Pain Management:    Induction: Intravenous  PONV Risk Score and Plan: 3 and Ondansetron , Dexamethasone  and Midazolam   Airway Management Planned: LMA  Additional Equipment:   Intra-op Plan:   Post-operative Plan: Extubation in OR  Informed Consent: I have reviewed the patients History and Physical, chart, labs and discussed the procedure including the risks, benefits and alternatives for the proposed anesthesia with the patient or  authorized representative who has indicated his/her understanding and acceptance.     Dental Advisory Given  Plan Discussed with: Anesthesiologist, CRNA and Surgeon  Anesthesia Plan Comments: (Patient consented for risks of anesthesia including but not limited to:  - adverse reactions to medications - damage to eyes, teeth, lips or other oral mucosa - nerve damage due to positioning  - sore throat or hoarseness - Damage to heart, brain, nerves, lungs, other parts of body or loss of life  Patient voiced understanding and assent.)        Anesthesia Quick Evaluation  "

## 2024-06-10 NOTE — Discharge Instructions (Addendum)
 Podiatry (Foot and Ankle) Hospital Discharge Instructions:  WOUND CARE / DRESSINGS:  Keep the dressings to your right foot/ leg clean, dry, and intact until your postoperative appointment in clinic.  If the dressings become wet or saturated or loosened, please contact our office for instructions.  If able to change the dressing then do so by removing the dressing, apply nonadherent gauze to the incision site, 4 x 4 gauze, roll gauze, Ace wrap.  Again only change the dressing if it becomes saturated or loosened, please leave the dressing on until your appointment if possible.  ACTIVITY: Ambulate as minimally as possible to the affected lower extremity in a surgical shoe at all times with heel contact only. Try to limit weightbearing to around the house activities.  The more able to stay off of your foot the more likely the incision is to heal.   Take your medication as prescribed.  Pain medication should be taken only as needed.  May take additional tylenol  as needed, 4000mg  a day is max dose.  If severe pain continues may take 1 tablet every 4 hours, if persistent then take 2 pain tablets every 6 hours, and if still persistent then take 2 pain tablets every 4 hours.  Keep the dressing clean, dry and intact.    Keep your foot elevated above the heart level for the first 48 hours.  Continue thereafter to improve swelling.  May apply ice for max 10 minutes out of every 1 hour as needed for pain and swelling.  Walking to the bathroom and brief periods of walking are acceptable, unless we have instructed you to be non-weight bearing.  Always wear your post-op shoe / CAM boot when walking.    Do not take a shower. Baths are permissible as long as the foot is kept out of the water.   Every hour you are awake:  Bend your knee 15 times. Massage calf 15 times  Call [facility] [phone]) if any of the following problems occur: You develop a temperature or fever. The bandage becomes saturated  with blood. Medication does not stop your pain. Injury of the foot occurs. Any symptoms of infection including redness, odor, or red streaks running from wound.   FOLLOW UP: Please make an appointment for 1 week after discharge.  If there are any questions or issues in reference to your lower extremity care that arise in interim of your next appointment, please call the clinic for assistance.    [facility]: [address] [place], [place] [address] Phone: [phone] Hours:Mon - Friday: 8:00am - 5:00pm

## 2024-06-10 NOTE — Interval H&P Note (Signed)
 [facility] History and Physical    Stacey Yates is a 48 y.o. female presenting today for pre-operative evaluation to address condition/dx of:        Encounter Diagnoses  Name Primary?   Post-operative state     Preoperative examination     Hallux limitus of right foot Yes   Bunion of right foot     Adductovarus rotation of toe, acquired, right        Based on the conservative treatment attempted, R/B/A held at prior appointment, it was agreed upon by patient and provider to undergo the surgical intervention of:    - Cheilectomy with capsulorraphy, first metatarsophalangeal joint, right foot - Hammer toe correction, right 5th digit     - Health status has not changed from the prior encounter.  - Lower extremity concerns as identified previously have not changed from the prior encounter.  No other acute pedal complaints at this time.    PMH:  - Illnesses:  Past Medical History      Past Medical History:  Diagnosis Date   ADD (attention deficit disorder)     Bipolar affect, depressed (CMS/HHS-HCC)     Herpes simplex          - Medications:     Your Medication List           Accurate as of June 10, 2024 12:07 AM. Always use your most recent med list.                  Instructions 8 am 12 pm 5 pm 8 pm Notes    dextroamphetamine -amphetamine  10 mg tablet Commonly known as: ADDERALL   Take 10 mg by mouth 2 (two) times daily as needed.                          diclofenac 1 % topical gel Commonly known as: VOLTAREN   Apply 2 g topically 3 (three) times daily                         gabapentin 300 MG capsule Commonly known as: NEURONTIN   Take 1 capsule (300 mg total) by mouth at bedtime for 3 days Take 1 capsule (300 mg total) by mouth at bedtime for 3 days starting your first night post operation.                         HYDROcodone-acetaminophen  5-325 mg tablet Commonly known as: NORCO   Take 1 tablet by mouth  every 4 (four) hours as needed for Pain for up to 4 days Take 1 tablet by mouth ever 4 (four) to 6 (six) hours as needed for pain for up to 4 days.                          lamoTRIgine  150 MG tablet Commonly known as: LaMICtal    Take 150 mg by mouth once daily                          lisinopriL  10 MG tablet Commonly known as: ZESTRIL    TAKE 1 TABLET(10 MG) BY MOUTH EVERY DAY                         ondansetron  4 MG disintegrating tablet Commonly  known as: ZOFRAN -ODT   Take 1 tablet (4 mg total) by mouth every 12 (twelve) hours as needed for Nausea for up to 5 days *This is 'as needed' for nausea. It is a tablet that disintegrates underneath your tongue.                          risperiDONE 0.5 MG disintegrating tablet Commonly known as: RisperDAL M-TABS   DISSOLVE 1 TO 2 TABLETS ON THE TONGUE DAILY AS NEEDED FOR ANXIETY OR AGITATION                         sennosides 8.6 mg tablet Commonly known as: SENOKOT   Take 1 tablet by mouth 2 (two) times daily as needed for Constipation for up to 5 days                          tobramycin 0.3 % ophthalmic solution Commonly known as: TOBREX   INSTILL 1 DROP IN LEFT EYE FOUR TIMES DAILY FOR 1 WEEK                          traZODone 50 MG tablet Commonly known as: DESYREL   Take 50 mg by mouth at bedtime as needed                          tretinoin 0.025 % cream Commonly known as: RETIN-A   APPLY A PEA SIZED AMOUNT TO DRY FACE EVERY OTHER NIGHT AS TOLERATED. SLOWLY BUILD UP TO NIGHTLY USE                          valACYclovir 500 MG tablet Commonly known as: VALTREX   TAKE 1 TABLET(500 MG) BY MOUTH DAILY                          VYVANSE 40 mg capsule Generic drug: lisdexamfetamine   Take 40 mg by mouth every morning                                 - Allergies:  Allergies  No Known Allergies      - Hospitalizations / Operations:  Past Surgical History       Past Surgical  History:  Procedure Laterality Date   RECONSTRUCTION BREAST W/TISSUE EXPANDER Right 1998    agenesis R breast,    AUGMENTATION MAMMAPLASTY Right 1998    saline implane after TE 2nd agenesisi R breast   LAPAROSCOPIC TUBAL LIGATION   2014   CERVICAL BIOPSY  W/ LOOP ELECTRODE EXCISION   06/20/2016    Done by Dr. Arloa- [facility]   Total laparoscopic hysterectomy with salpingectomy   03/08/2017    Done by Dr. Arloa   [email or web address]    05/31/2023    IntHem/Rpt41yrs/CTL        Issues noted with anesthesia in past: no   Social History:  - Smoking history: no - Housing: housed - Familial support: children - independent          No data to display               PHYSICAL EXAMINATION:  LMP 08/25/2016 (Exact Date) Comment: Has irregular cycles per  pt.    GENERAL: Awake, conversational. No acute distress.    HENT: Atraumatic, mucous membranes moist, oropharynx clear, no thyromegaly.    EYES: EOMI, PERRLA.    PULMONARY: Breathing easily on room air, no respiratory distress.    NECK: Normal ROM, supple. No C-spine tenderness to palpation.    CARDIAC: Normal rate. Radial pulses 2+ and symmetric bilaterally.    ABDOMEN: Soft, nondistended, no tenderness to palpation, no guarding.    BACK: No midline tenderness, no CVA tenderness.    EXTREMITIES: Moving all extremities, LE focused exam as below.    SKIN: Warm, dry, no rashes. LE as noted below.    NEURO: A&Ox3, speech clear, symmetrical face, cranial nerves intact, distal strength 5/5 in b/l UE.   Lower Extremity Focused Exam:  Neurological: Gross protective sensation intact bilaterally. No focal motor or sensory deficits identified bilaterally. No Tinels or Valliexs sign bilaterally. + Joplins neuritis noted to bilateral foot.   Vascular: Dorsalis Pedis artery on palpation: 2 Posterior Tibial artery on palpation: 1 Capillary Filling Times: WNL Peripheral edema: none  Dependency Rubor: none Varicosities: Mild    Dermatological: Skin quality: WNL bilaterally. No ulcerations, open wounds noted bilaterally. Interdigital spaces: C/D/I bilaterally. Hyperkeratotic FOCAL tissue noted: Distal aspect of the fifth digit most consistent with listers corn as well as diffuse to the PIPJ lateral aspect -bilaterally R>L   Musculoskeletal: Overall foot type: Fairly rectus with wider forefoot bilaterally, adductovarus component noted to the fifth digit bilaterally WB -arch noted Toes abutting/ over-ridding: Yes, fifth digit is underwriting the fourth bilaterally Global foot - TTP: Noted to the fifth digit bilaterally with adductovarus component, also noted to the medial bump prominence at the first metatarsal head bilaterally R>L Tendons: Achilles, Posterior Tibialis, Anterior Tibialis, Peroneals, EDL/ FDL, EHL/ FDL. Palpated and felt to be intact and without pain along tendon course. Muscle strength: 5/5 in all 4 quadrants Ankle Joint ROM: Decreased, equinus noted bilaterally STJ ROM: WNL  1st TMT ROM: WNL  1st MPJ: ROM: WNL, however mildly decreased with end range of motion no crepitus felt. Medial bump TTP -present -clinically there is more of a prominence noted on the left side, right side as well however less notable than left. Trackbound deformity present     XR IMAGING: 3 views of the RIGHT foot obtained. DATE: 12/ 01/2024   Calcaneal Inclination Angle (N = 20-30): WNL  Cyma Line (N = Congruous ; A= pes planus): Congruous more in rectus alignment than the left side Intermetatarsal Angle (N = 8-12): WNL Hallux ABDuctus Angle: WNL Hallux ABDuctus INTERphalangeus Angle (N= 8- 10): NL 1st MPJ joint space: There is mild joint space narrowing noted on the medial aspect of the first metatarsophalangeal joint however there are no gross frank cystic changes noted or osseous spurring to the medial and lateral condyles noted on AP view.  There is noted osseous proliferation as well as spurring to the medial  aspect of the joint primarily noted on oblique view as well as osseous prominence.  Cystic changes noted in this view. 1st MPJ alignment: Congruous Osseous spurring noted to the plantar and posterior aspect of the calcaneus More significant contracture noted to the fifth digit with rotational component underlying the fourth digit soft tissue envelope.  Consistent with adductovarus rotation of the fifth digit.   Impression: 3 views of the foot with the osseous structure alignment some prominence suggestive of adductovarus deformity of the fifth digit and beginning stages of osteoarthritis versus potential clinical hallux limitus of  the right first MPJ. The impression of the radiographs were discussed with patient with additional reference to clinical symptoms and potential treatment options.   ASSESSMENT AND PLAN: This visit today consisted of multiple elements: Number and complexity of problems addressed: Moderate risk  Risk of morbidity\mortality from additional diagnostic testing or treatment: Moderate risk    I have determined the complexity of the problem and Condition(s), etiologies, and options for care, treatment plan, and prognosis were reviewed with the patient. Condition, etiologies, and options for care, treatment plan, and prognosis have all been reviewed with the patient. Conservative and surgical options for care have been discussed and patient was advised of the different treatment options available. R/B/A discussion held. All questions were answered in detail to patient satisfaction.    Given the inability for prior conservative treatment (splinting, taping, stretches, change in shoe gear, topicals) to adequately address the patients condition, patient has opted to discuss surgical intervention.  Patient educated about continued conservative care vs. surgical intervention, and the patient has elected for surgical management of this condition. Patient has been advised of the approximate  disability period involved for these procedures. In addition, the patient has been advised as to the alternatives of care including but not limited to; continued conservative care as well as surgical procedures (such as distal metatarsal osteotomy  ( bi correctional +/- decompressional osteotomy) - fifth digit pinning, capsular work at 5th MPJ, soft tissue rebalancing, arthroplasty versus arthrodesis)    The patient verbalized their understanding of the Moderate risks and complications that could occur, including to but not limited to: recurrence of issue / condition (tightness / limiting / arthritis / deformity / callus formation) numbness, nerve-related issues (i.e. transient neuritis vs. CRPS), pain (acute & chronic), injury to adjacent tissues / vessels / structures, infection, edema, DVT, scar formation, the possible need for additional surgical intervention, as well as potential anesthesia related complications.    Below details some of the information specific to this case that has been discussed and reviewed by both the patient and the provider:    [x]  Planned procedure:  - Bunionectomy without osteotomy, first metatarsophalangeal joint, right foot.  - hammertoe correction (derotational arthroplasty +/- hemiphalangectomy), 5th digit, right foot   [x]  Imaging Obtained:  Date:05/19/2024  Type: XR   [x]  Planned WB course: WBAT in post operative shoe versus short boot   [x]  Pre-op - Done Today. Additional medical clearance needed: no   [x]  Surgical Consent: Signed and documented in chart.      []  POST-OP PREPPING:  - WB apparatus dispensed: TBD immediate post op ; Cast Cover: none   - Pain Medication:  [x]  Hydrocodone/APAP [NORCO] 5/325mg  Tab #24 Q4-6H prn pain ; advised not to take APAP products concurrently. RX DISPENSED AT TODAYS VISIT: yes     + Gabapentin 300mg QHS x 3 days; to initiate 1st day post-op.  RX DISPENSED AT TODAYS VISIT: yes   - DVT ppx:  [x]  none requied    -  Bowel Regimen: Senna 15mg  QD x 5 days prn constipation  RX DISPENSED AT TODAYS VISIT: yes   -Anti-Nausea Regimen: Ondansetron  4mg  BID x 5 days prn nausea RX DISPENSED AT TODAYS VISIT: yes   - Post op apt 1: 1 week post op - incision check  - Post-op apt 2: suture removal The patient was given the opportunity to ask questions, which were answered to the best of my ability. The patient is to call if any further questions arise.

## 2024-06-11 NOTE — Anesthesia Postprocedure Evaluation (Signed)
"   Anesthesia Post Note  Patient: Stacey Yates  Procedure(s) Performed: CHEILECTOMY (Right) CORRECTION, HAMMER TOE (Right: Toe)  Patient location during evaluation: PACU Anesthesia Type: General Level of consciousness: awake and alert Pain management: pain level controlled Vital Signs Assessment: post-procedure vital signs reviewed and stable Respiratory status: spontaneous breathing, nonlabored ventilation, respiratory function stable and patient connected to nasal cannula oxygen Cardiovascular status: blood pressure returned to baseline and stable Postop Assessment: no apparent nausea or vomiting Anesthetic complications: no   No notable events documented.   Last Vitals:  Vitals:   06/10/24 1545 06/10/24 1600  BP: 117/84 (!) 116/90  Pulse: 79 78  Resp: 11 18  Temp: (!) 36.2 C 37.2 C  SpO2: 100% 98%    Last Pain:  Vitals:   06/10/24 1600  TempSrc: Temporal  PainSc: 0-No pain                 [facility]      "

## 2024-06-16 NOTE — Progress Notes (Signed)
 POST OP:  Stacey Yates is a 49 y.o. female presenting to clinic 1 weeks s/p 1st MPJ Cheilectomy with medial capsulorraphy and 5th digit derotational arthroplasty; doing well.   DOS: 06/10/2024  WB Status: Primarily heel WB in CAM boot Pain Scale: 5/10.  Pain controlled with: medications + RICE Bowel Movements: WNL ABX Rx: none Dressings: c/d/i   SUBJECTIVE: Patient is doing well, reports that she has not done too much activity and has been compliant with boot usage.  She has not had much pain after the first 3 days and primarily just elevated to assist with that.  She had mild intermittent ' zingers' to her foot that have subsided approximately 2 days after surgery. Denies F/C/N/V/SOB/CP. No acute pedal complaints at this time.  PHYSICAL EXAMINATION:  BP 120/84   Pulse 84   Ht 157.5 cm (5' 2)   Wt 56.7 kg (125 lb)   LMP 08/25/2016 (Exact Date) Comment: Has irregular cycles per pt.   BMI 22.86 kg/m   GEN: NAD. AOX3. ? RESP: Non-labored breathing on RA.? ABD: NT/ND of all four quadrants.? NEURO: Moving all four extremities spontaneously. ? ? FOCUSED LOWER EXTREMITY EXAMINATION:?  NEURO: ? - No gross motor deficits noted - No paresthesias elicited on examination. ??   VASCULAR: ? - DP/PT 2 - Capillary refill - Edema noted to surgical extremity as anticipated for acute postoperative state   MSK: ? - TTP - none noted on exam - No calf tenderness. ?   DERM: ? - Dressings c/d/I - ACE. - No proximal streaking. No malodor. ? - incision site(s) x 2 (1st MPJ / 5th digit) intact without any evidence of dehiscence - interdigital space c/d/I    06/17/2024: 3 Views taken and reviewed of the right foot: AP, Lat, Oblique   Soft Tissue Density: WNL Bone Density: WNL Fracture: None  Dorsal exostosis of the first metatarsal head has been planed and is now smooth in comparison to preoperative imaging. Most notably on the oblique view, the dorsomedial eminence and cystic  changes/osseous spurring have been resected and planed. First metatarsal phalangeal joint is symmetric with equal joint space from medial to lateral aspect, this is improved alignment as well as overall increased joint spacing in comparison to preoperative imaging where medial asymmetric narrowing is noted.  Noted osteotomy site at the proximal phalanx of the fifth digit, there is improvement in alignment and the rotational capacity achieved postoperatively in comparison to preoperative imaging.  Impression:  - Proved overall alignment of the first MPJ with improved joint space symmetry and appropriate resection of dorsal spurring, right foot - S/p derotational arthroplasty fifth digit  This impression was discussed/ shared with the patient.   ASSESSMENT AND PLAN POST-OPERATIVE VISIT:   DOS: 06/10/2024 #S/P 1st MPJ Cheilectomy with medial capsulorraphy and 5th digit derotational arthroplasty; doing well and progressing as anticipated.   Number and complexity of problems addressed: Moderate risk Risk of morbidity\mortality from additional diagnostic testing or treatment: Moderate risk  Patient educated about continued conservative management in the post-opertaive period.  Patient has been advised of the approximate disability period involved for the procedure conducted. Below details some of the information specific to this case / current encounter that has been discussed and reviewed by both the patient and the provider:   [email or web address] ] Imaging Obtained/ Reviewed at this visit: 06/17/2024 [no ] Sutures removed - Date: next week   - PAIN control: No additional medication required  ----WOUND CARE-----  - Advised to keep incision site  dressings c/d/i. While sutures in place, patient advised NOT to soak foot in water or to get wet. Dressings are to remain CLEAN/ DRY and not to be removed unless otherwise directed by the provider.  - Advised patient on clinical soi and recommended to seek medical  assistance should those present.   ----WEIGHTBEARING ACTIVITY---- -  Heel weightbearing in a cam boot  ----SWELLING----  - Advised to elevated - 'toes above nose' ; keeping foot/leg above heart level as much as possible in the first week to two weeks of surgery.  - Educated patient on edematous period that acutely can last months to a year post-operatively.*  - s/p suture removal advised to wear compression socks to assist in edema control Knee high. Starting with lowest grade setting 10-31mmHg and increasing as tolerated. To wear in day time and remove for sleep.   The patient was given the opportunity to ask questions, which were answered to the best of my ability. The patient is to call if any further questions arise.   RTC 1 week for suture removal

## 2024-07-11 ENCOUNTER — Encounter: Admitting: Podiatry

## 2024-07-18 ENCOUNTER — Encounter: Admitting: Podiatry

## 2024-08-01 ENCOUNTER — Encounter: Admitting: Podiatry
